# Patient Record
Sex: Female | Born: 1980 | Race: White | Hispanic: No | Marital: Single | Smoking: Never smoker
Health system: Southern US, Community
[De-identification: ages and names within clinical notes are randomized; demographics above are authoritative.]

## PROBLEM LIST (undated history)

## (undated) DIAGNOSIS — J45909 Unspecified asthma, uncomplicated: Secondary | ICD-10-CM

## (undated) DIAGNOSIS — E611 Iron deficiency: Secondary | ICD-10-CM

## (undated) HISTORY — PX: TONSILLECTOMY: SUR1361

---

## 2010-05-23 ENCOUNTER — Inpatient Hospital Stay (HOSPITAL_COMMUNITY): Admission: AD | Admit: 2010-05-23 | Discharge: 2010-05-23 | Payer: Self-pay | Admitting: Obstetrics and Gynecology

## 2010-05-27 ENCOUNTER — Inpatient Hospital Stay (HOSPITAL_COMMUNITY): Admission: AD | Admit: 2010-05-27 | Discharge: 2010-05-31 | Payer: Self-pay | Admitting: Obstetrics and Gynecology

## 2010-08-18 ENCOUNTER — Emergency Department (HOSPITAL_COMMUNITY): Admission: EM | Admit: 2010-08-18 | Discharge: 2010-08-19 | Payer: Self-pay | Admitting: Emergency Medicine

## 2011-03-13 LAB — CBC
HCT: 33.3 % — ABNORMAL LOW (ref 36.0–46.0)
HCT: 36 % (ref 36.0–46.0)
Hemoglobin: 11.2 g/dL — ABNORMAL LOW (ref 12.0–15.0)
Hemoglobin: 12.3 g/dL (ref 12.0–15.0)
MCHC: 33.7 g/dL (ref 30.0–36.0)
MCHC: 33.8 g/dL (ref 30.0–36.0)
MCV: 80.2 fL (ref 78.0–100.0)
MCV: 81.1 fL (ref 78.0–100.0)
MCV: 81.2 fL (ref 78.0–100.0)
Platelets: 232 10*3/uL (ref 150–400)
Platelets: 263 10*3/uL (ref 150–400)
RBC: 4.1 MIL/uL (ref 3.87–5.11)
RBC: 4.48 MIL/uL (ref 3.87–5.11)
RDW: 16.9 % — ABNORMAL HIGH (ref 11.5–15.5)
RDW: 16.9 % — ABNORMAL HIGH (ref 11.5–15.5)
WBC: 13.8 10*3/uL — ABNORMAL HIGH (ref 4.0–10.5)

## 2011-03-13 LAB — COMPREHENSIVE METABOLIC PANEL
ALT: 50 U/L — ABNORMAL HIGH (ref 0–35)
AST: 46 U/L — ABNORMAL HIGH (ref 0–37)
CO2: 26 mEq/L (ref 19–32)
Calcium: 8.5 mg/dL (ref 8.4–10.5)
Creatinine, Ser: 0.7 mg/dL (ref 0.4–1.2)
GFR calc Af Amer: >60 mL/min (ref >60)
GFR calc non Af Amer: >60 mL/min (ref >60)
Sodium: 136 mEq/L (ref 135–145)
Total Protein: 5.5 g/dL — ABNORMAL LOW (ref 6.0–8.3)

## 2019-09-04 DIAGNOSIS — J309 Allergic rhinitis, unspecified: Secondary | ICD-10-CM | POA: Insufficient documentation

## 2019-09-04 DIAGNOSIS — J45901 Unspecified asthma with (acute) exacerbation: Secondary | ICD-10-CM | POA: Insufficient documentation

## 2019-09-04 DIAGNOSIS — R438 Other disturbances of smell and taste: Secondary | ICD-10-CM | POA: Insufficient documentation

## 2019-09-04 DIAGNOSIS — J45909 Unspecified asthma, uncomplicated: Secondary | ICD-10-CM | POA: Insufficient documentation

## 2020-01-30 DIAGNOSIS — Z20822 Contact with and (suspected) exposure to covid-19: Secondary | ICD-10-CM | POA: Insufficient documentation

## 2020-01-30 DIAGNOSIS — R059 Cough, unspecified: Secondary | ICD-10-CM | POA: Insufficient documentation

## 2020-01-30 DIAGNOSIS — Z1152 Encounter for screening for COVID-19: Secondary | ICD-10-CM | POA: Insufficient documentation

## 2020-01-30 DIAGNOSIS — J45901 Unspecified asthma with (acute) exacerbation: Secondary | ICD-10-CM | POA: Insufficient documentation

## 2020-03-06 ENCOUNTER — Encounter: Payer: Self-pay | Admitting: Emergency Medicine

## 2020-03-06 ENCOUNTER — Ambulatory Visit
Admission: EM | Admit: 2020-03-06 | Discharge: 2020-03-06 | Disposition: A | Discharge status: Home / Self Care | Payer: Managed Care, Other (non HMO)

## 2020-03-06 ENCOUNTER — Other Ambulatory Visit: Payer: Self-pay

## 2020-03-06 ENCOUNTER — Ambulatory Visit (INDEPENDENT_AMBULATORY_CARE_PROVIDER_SITE_OTHER): Payer: Managed Care, Other (non HMO)

## 2020-03-06 DIAGNOSIS — R5383 Other fatigue: Secondary | ICD-10-CM

## 2020-03-06 DIAGNOSIS — J329 Chronic sinusitis, unspecified: Secondary | ICD-10-CM

## 2020-03-06 DIAGNOSIS — R05 Cough: Secondary | ICD-10-CM | POA: Diagnosis not present

## 2020-03-06 DIAGNOSIS — R053 Chronic cough: Secondary | ICD-10-CM

## 2020-03-06 DIAGNOSIS — J320 Chronic maxillary sinusitis: Secondary | ICD-10-CM

## 2020-03-06 DIAGNOSIS — R5382 Chronic fatigue, unspecified: Secondary | ICD-10-CM

## 2020-03-06 HISTORY — DX: Unspecified asthma, uncomplicated: J45.909

## 2020-03-06 MED ORDER — FLUTICASONE PROPIONATE 50 MCG/ACT NA SUSP: 1.0000 | Freq: Every day | NASAL | 0 refills | Status: AC

## 2020-03-06 MED ORDER — MONTELUKAST SODIUM 10 MG PO TABS: 10.0000 mg | ORAL_TABLET | Freq: Every day | ORAL | 0 refills | Status: DC

## 2020-03-06 MED ORDER — AEROCHAMBER PLUS FLO-VU MEDIUM MISC: 1.0000 | Freq: Once | 0 refills | Status: AC

## 2020-03-06 MED ORDER — PREDNISONE 10 MG (21) PO TBPK: ORAL_TABLET | Freq: Every day | ORAL | 0 refills | Status: DC

## 2020-03-06 NOTE — Discharge Instructions (Addendum)
Take steroid as discussed: 6-5-4-3-2-1 Follow-up with your primary care for further evaluation as you may need referral to specialist(s) and/or additional diagnostics that we do not have here. Go to ER for sudden onset of weakness, dizziness, nausea, chest pain, difficulty breathing, lower leg swelling

## 2020-03-06 NOTE — ED Notes (Signed)
Patient able to ambulate independently  

## 2020-03-06 NOTE — ED Provider Notes (Signed)
[name]    CSN: [id number] Arrival date & time: 03/06/20  3267      History   Chief Complaint Chief Complaint  Patient presents with  . Cough  . Fatigue    HPI Debbie Contreras is a 39 y.o. female with history of asthma presenting for persistent fatigue and URI symptoms for the last month.  Patient states she has previously been evaluated for this by her PCP 3 weeks ago.  Underwent chest x-ray and Covid testing which were both reportedly negative.  Given a prednisone taper for 6 days which provided relief of cough, dyspnea, fatigue, but "then came right back" upon discontinuation.  Patient states she feels tired just from talking, though denies dyspnea with exertion.  Denies history of GERD, thyroid disease.  Does have her albuterol and Symbicort inhalers at home.  Reporting compliance with 2 puffs twice daily regimen of Symbicort.  Currently using albuterol inhaler 2 times daily with moderate relief.  Patient denies lower extremity edema, history of DVT/PE, fever, chills, myalgias or malaise.  Able to eat, drink without difficulty.  No nausea, vomiting, abdominal pain or diarrhea.    Past Medical History:  Diagnosis Date  . Asthma     There are no problems to display for this patient.   History reviewed. No pertinent surgical history.  OB History   No obstetric history on file.      Home Medications    Prior to Admission medications   Medication Sig Start Date End Date Taking? Authorizing Provider  albuterol (VENTOLIN HFA) 108 (90 Base) MCG/ACT inhaler Inhale into the lungs every 6 (six) hours as needed for wheezing or shortness of breath.   Yes [provider]  budesonide-formoterol (SYMBICORT) 80-4.5 MCG/ACT inhaler Inhale 2 puffs into the lungs 2 (two) times daily.   Yes [provider]  fluticasone (FLONASE) 50 MCG/ACT nasal spray Place 1 spray into both nostrils daily. 03/06/20   Hall-Potvin, [place], PA-C  montelukast (SINGULAIR)  10 MG tablet Take 1 tablet (10 mg total) by mouth at bedtime. 03/06/20   Hall-Potvin, [place], PA-C  predniSONE (STERAPRED UNI-PAK 21 TAB) 10 MG (21) TBPK tablet Take by mouth daily. Take steroid taper as written 03/06/20   Hall-Potvin, [place], PA-C  Spacer/Aero-Holding Chambers (AEROCHAMBER PLUS FLO-VU MEDIUM) MISC 1 each by Other route once for 1 dose. 03/06/20 03/06/20  Hall-Potvin, [place], PA-C    Family History Family History  Problem Relation Age of Onset  . Cervical cancer Mother   . COPD Father     Social History Social History   Tobacco Use  . Smoking status: Never Smoker  . Smokeless tobacco: Never Used  Substance Use Topics  . Alcohol use: Not Currently  . Drug use: Never     Allergies   Patient has no known allergies.   Review of Systems As per HPI   Physical Exam Triage Vital Signs ED Triage Vitals  Enc Vitals Group     BP 03/06/20 0822 126/85     Pulse Rate 03/06/20 0822 81     Resp 03/06/20 0822 18     Temp 03/06/20 0822 97.6 F (36.4 C)     Temp Source 03/06/20 0822 Temporal     SpO2 03/06/20 0822 96 %     Weight --      Height --      Head Circumference --      Peak Flow --      Pain Score 03/06/20 0824 0  Pain Loc --      Pain Edu? --      Excl. in GC? --    No data found.  Updated Vital Signs BP 126/85 (BP Location: Left Arm)   Pulse 81   Temp 97.6 F (36.4 C) (Temporal)   Resp 18   SpO2 96%   Visual Acuity Right Eye Distance:   Left Eye Distance:   Bilateral Distance:    Right Eye Near:   Left Eye Near:    Bilateral Near:     Physical Exam Constitutional:      General: She is not in acute distress.    Appearance: She is obese. She is not ill-appearing or diaphoretic.  HENT:     Head: Normocephalic and atraumatic.     Nose:     Comments: Negative sinus tenderness bilaterally    Mouth/Throat:     Mouth: Mucous membranes are moist.     Pharynx: Oropharynx is clear. No oropharyngeal exudate or posterior oropharyngeal  erythema.     Comments: Uvula midline, nonedematous Eyes:     General: No scleral icterus.    Extraocular Movements: Extraocular movements intact.     Conjunctiva/sclera: Conjunctivae normal.     Pupils: Pupils are equal, round, and reactive to light.  Neck:     Comments: Trachea midline, negative JVD Cardiovascular:     Rate and Rhythm: Normal rate and regular rhythm.     Heart sounds: No murmur. No gallop.   Pulmonary:     Effort: Pulmonary effort is normal. No respiratory distress.     Breath sounds: No wheezing, rhonchi or rales.  Musculoskeletal:     Cervical back: Neck supple. No tenderness.     Right lower leg: No edema.     Left lower leg: No edema.  Lymphadenopathy:     Cervical: No cervical adenopathy.  Skin:    Capillary Refill: Capillary refill takes less than 2 seconds.     Coloration: Skin is not jaundiced or pale.     Findings: No rash.  Neurological:     General: No focal deficit present.     Mental Status: She is alert and oriented to person, place, and time.      UC Treatments / Results  Labs (all labs ordered are listed, but only abnormal results are displayed) Labs Reviewed - No data to display  EKG   Radiology DG Chest 2 View  Result Date: 03/06/2020 CLINICAL DATA:  Fatigue, cough EXAM: CHEST - 2 VIEW COMPARISON:  None. FINDINGS: Lungs are clear.  No pleural effusion or pneumothorax. The heart is normal in size. Visualized osseous structures are within normal limits. IMPRESSION: Normal chest radiographs. Electronically Signed   By: Charline Bills M.D.   On: 03/06/2020 09:15    Procedures Procedures (including critical care time)  Medications Ordered in UC Medications - No data to display  Initial Impression / Assessment and Plan / UC Course  I have reviewed the triage vital signs and the nursing notes.  Pertinent labs & imaging results that were available during my care of the patient were reviewed by me and considered in my medical  decision making (see chart for details).     Patient afebrile, nontoxic in office today.  Covid testing deferred due to duration of symptoms with previous negative test and no second worsening.  Chest x-ray done office, reviewed by me and radiology without previous to compare: Negative for pleural effusion, pneumothorax, cardiomegaly, infiltrate.  Reviewed findings with patient verbalized  understanding.  Will treat supportively as outlined below.  Refilled prednisone taper at patient's request, and provided contact information for ENT office for further evaluation as patient voiced frustration about chronic sinusitis "for 3 years now".  Return precautions discussed, patient verbalized understanding and is agreeable to plan. Final Clinical Impressions(s) / UC Diagnoses   Final diagnoses:  Chronic cough  Chronic fatigue  Chronic maxillary sinusitis     Discharge Instructions     Take steroid as discussed: 6-5-4-3-2-1 Follow-up with your primary care for further evaluation as you may need referral to specialist(s) and/or additional diagnostics that we do not have here. Go to ER for sudden onset of weakness, dizziness, nausea, chest pain, difficulty breathing, lower leg swelling    ED Prescriptions    Medication Sig Dispense Auth. Provider   montelukast (SINGULAIR) 10 MG tablet Take 1 tablet (10 mg total) by mouth at bedtime. 30 tablet Hall-Potvin, [place], PA-C   fluticasone (FLONASE) 50 MCG/ACT nasal spray Place 1 spray into both nostrils daily. 16 g Hall-Potvin, [place], PA-C   Spacer/Aero-Holding Chambers (AEROCHAMBER PLUS FLO-VU MEDIUM) MISC 1 each by Other route once for 1 dose. 1 each Hall-Potvin, [place], PA-C   predniSONE (STERAPRED UNI-PAK 21 TAB) 10 MG (21) TBPK tablet Take by mouth daily. Take steroid taper as written 21 tablet Hall-Potvin, [place], PA-C     PDMP not reviewed this encounter.   Hall-Potvin, [place], PA-C 03/06/20 0930

## 2020-03-06 NOTE — ED Triage Notes (Addendum)
Pt presents to [facility] for assessment of fatigue, cough, stuffy nose, sore throat x 1 month.  C/o shortness of breath with exertion, and difficulty getting a full breath in, even at rest.  Hx of asthma.  States "I don't feel like my inhaler isn't working".  Already seen by PCP, and COVID negative.

## 2020-03-15 DIAGNOSIS — J339 Nasal polyp, unspecified: Secondary | ICD-10-CM | POA: Insufficient documentation

## 2020-03-15 DIAGNOSIS — R430 Anosmia: Secondary | ICD-10-CM | POA: Insufficient documentation

## 2020-03-15 DIAGNOSIS — J45909 Unspecified asthma, uncomplicated: Secondary | ICD-10-CM | POA: Insufficient documentation

## 2020-03-30 DIAGNOSIS — J324 Chronic pansinusitis: Secondary | ICD-10-CM | POA: Insufficient documentation

## 2020-06-15 ENCOUNTER — Ambulatory Visit: Payer: Self-pay | Admitting: Otolaryngology

## 2020-06-15 NOTE — H&P (Addendum)
HPI:   Debbie Contreras is a 39 y.o. female who presents as a return Patient.   Current problem: Sinusitis.  HPI: Return visit. The improvement that she received after the treatment, has completely gone away and now she is completely stopped up and has anosmia again.  PMH/Meds/All/SocHx/FamHx/ROS:   Past Medical History:  Diagnosis Date  . Asthma   Past Surgical History:  Procedure Laterality Date  . NO PAST SURGERIES   No family history of bleeding disorders, wound healing problems or difficulty with anesthesia.   Social History   Socioeconomic History  . Marital status: Single  Spouse name: Not on file  . Number of children: Not on file  . Years of education: Not on file  . Highest education level: Not on file  Occupational History  . Not on file  Tobacco Use  . Smoking status: Former Smoker  Quit date: 2018  Years since quitting: 3.4  . Smokeless tobacco: Never Used  Substance and Sexual Activity  . Alcohol use: Not Currently  . Drug use: Not Currently  . Sexual activity: Not on file  Other Topics Concern  . Not on file  Social History Narrative  . Not on file   Social Determinants of Health   Financial Resource Strain:  . Difficulty of Paying Living Expenses:  Food Insecurity:  . Worried About Programme researcher, broadcasting/film/video in the Last Year:  . Barista in the Last Year:  Transportation Needs:  . Freight forwarder (Medical):  Marland Kitchen Lack of Transportation (Non-Medical):  Physical Activity:  . Days of Exercise per Week:  . Minutes of Exercise per Session:  Stress:  . Feeling of Stress :  Social Connections:  . Frequency of Communication with Friends and Family:  . Frequency of Social Gatherings with Friends and Family:  . Attends Religious Services:  . Active Member of Clubs or Organizations:  . Attends Banker Meetings:  Marland Kitchen Marital Status:   Current Outpatient Medications:  . AEROCHAMBER PLUS FLOW-VU Spcr, , Disp: , Rfl:  . albuterol 90  mcg/actuation inhaler, Inhale into the lungs., Disp: , Rfl:  . cetirizine (ZYRTEC) 10 MG tablet, , Disp: , Rfl:  . fluticasone propionate (FLONASE) 50 mcg/actuation nasal spray, , Disp: , Rfl:  . levonorgestreL (MIRENA) 20 mcg/24 hours (6 yrs) 52 mg IUD, Mirena 20 mcg/24 hours (6 yrs) 52 mg intrauterine device Take 1 device by intrauterine route., Disp: , Rfl:  . montelukast (SINGULAIR) 10 mg tablet, , Disp: , Rfl:  . predniSONE (DELTASONE) 20 MG tablet, Take 3 times a day for 3 days, two times a day for 3 days, one a day for 3 days., Disp: 18 tablet, Rfl: 0 . SYMBICORT 160-4.5 mcg/actuation inhaler, , Disp: , Rfl:    Physical Exam:   Overweight lady in no distress. She has a hyponasal voice. Her breathing is clear. 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 Oral cavity and pharynx are clear. Eyes look normal. No external swelling or masses.  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 She is interested in surgical correction of this. Recommend proceed with endoscopic nasal polypectomy and pan sinusectomy. We will do this at the hospital given her significant asthma history. Risks and benefits were discussed including injury to the eye and brain.

## 2020-06-17 ENCOUNTER — Encounter (HOSPITAL_COMMUNITY): Payer: Self-pay

## 2020-06-17 ENCOUNTER — Encounter (HOSPITAL_COMMUNITY)
Admission: RE | Admit: 2020-06-17 | Discharge: 2020-06-17 | Disposition: A | Discharge status: Home / Self Care | Payer: Managed Care, Other (non HMO) | Source: Ambulatory Visit | Attending: Otolaryngology | Admitting: Otolaryngology

## 2020-06-17 ENCOUNTER — Other Ambulatory Visit: Payer: Self-pay

## 2020-06-17 DIAGNOSIS — Z01812 Encounter for preprocedural laboratory examination: Secondary | ICD-10-CM | POA: Insufficient documentation

## 2020-06-17 LAB — CBC
HCT: 40.6 % (ref 36.0–46.0)
Hemoglobin: 13.1 g/dL (ref 12.0–15.0)
MCH: 27.2 pg (ref 26.0–34.0)
MCHC: 32.3 g/dL (ref 30.0–36.0)
MCV: 84.4 fL (ref 80.0–100.0)
Platelets: 309 10*3/uL (ref 150–400)
RBC: 4.81 MIL/uL (ref 3.87–5.11)
RDW: 13.7 % (ref 11.5–15.5)
WBC: 9.8 10*3/uL (ref 4.0–10.5)
nRBC: 0 % (ref 0.0–0.2)

## 2020-06-17 NOTE — Pre-Procedure Instructions (Signed)
[facility] [place], [place] [address] [place] [address] Phone: [phone] Fax: [phone]  [facility] [address], [place] [address] Phone: [phone] Fax: [phone]      Your procedure is scheduled on Friday July 2nd.  Report to [facility] Main Entrance "A" at 8:30 A.M., and check in at the Admitting office.  Call this number if you have problems the morning of surgery:  [phone]  Call [phone] if you have any questions prior to your surgery date Monday-Friday 8am-4pm    Remember:  Do not eat or drink after midnight the night before your surgery     Take these medicines the morning of surgery with A SIP OF WATER  budesonide-formoterol (SYMBICORT)  cetirizine (ZYRTEC) fluticasone (FLONASE) albuterol (VENTOLIN HFA) if needed- bring inhalers to the hospital with you  As of today, STOP taking any Aspirin (unless otherwise instructed by your surgeon) and Aspirin containing products, Aleve, Naproxen, Ibuprofen, Motrin, Advil, Goody's, BC's, all herbal medications, fish oil, and all vitamins.                      Do not wear jewelry, make up, or nail polish            Do not wear lotions, powders, perfumes/colognes, or deodorant.            Do not shave 48 hours prior to surgery.  Men may shave face and neck.            Do not bring valuables to the hospital.            [facility] is not responsible for any belongings or valuables.  Do NOT Smoke (Tobacco/Vapping) or drink Alcohol 24 hours prior to your procedure If you use a CPAP at night, you may bring all equipment for your overnight stay.   Contacts, glasses, dentures or bridgework may not be worn into surgery.      For patients admitted to the hospital, discharge time will be determined by your treatment team.   Patients discharged the day of  surgery will not be allowed to drive home, and someone needs to stay with them for 24 hours.    Special instructions:   [place]- Preparing For Surgery  Before surgery, you can play an important role. Because skin is not sterile, your skin needs to be as free of germs as possible. You can reduce the number of germs on your skin by washing with CHG (chlorahexidine gluconate) Soap before surgery.  CHG is an antiseptic cleaner which kills germs and bonds with the skin to continue killing germs even after washing.    Oral Hygiene is also important to reduce your risk of infection.  Remember - BRUSH YOUR TEETH THE MORNING OF SURGERY WITH YOUR REGULAR TOOTHPASTE  Please do not use if you have an allergy to CHG or antibacterial soaps. If your skin becomes reddened/irritated stop using the CHG.  Do not shave (including legs and underarms) for at least 48 hours prior to first CHG shower. It is OK to shave your face.  Please follow these instructions carefully.   1. Shower the NIGHT BEFORE SURGERY and the MORNING OF SURGERY with CHG Soap.   2. If you chose to wash your hair, wash your hair first as usual with your  normal shampoo.  3. After you shampoo, rinse your hair and body thoroughly to remove the shampoo.  4. Use CHG as you would any other liquid soap. You can apply CHG directly to the skin and wash gently with a scrungie or a clean washcloth.   5. Apply the CHG Soap to your body ONLY FROM THE NECK DOWN.  Do not use on open wounds or open sores. Avoid contact with your eyes, ears, mouth and genitals (private parts). Wash Face and genitals (private parts)  with your normal soap.   6. Wash thoroughly, paying special attention to the area where your surgery will be performed.  7. Thoroughly rinse your body with warm water from the neck down.  8. DO NOT shower/wash with your normal soap after using and rinsing off the CHG Soap.  9. Pat yourself dry with a CLEAN TOWEL.  10. Wear CLEAN  PAJAMAS to bed the night before surgery, wear comfortable clothes the morning of surgery  11. Place CLEAN SHEETS on your bed the night of your first shower and DO NOT SLEEP WITH PETS.   Day of Surgery:   Do not apply any deodorants/lotions.  Please wear clean clothes to the hospital/surgery center.   Remember to brush your teeth WITH YOUR REGULAR TOOTHPASTE.   Please read over the following fact sheets that you were given.

## 2020-06-22 ENCOUNTER — Other Ambulatory Visit (HOSPITAL_COMMUNITY): Payer: Managed Care, Other (non HMO)

## 2020-06-24 ENCOUNTER — Other Ambulatory Visit (HOSPITAL_COMMUNITY): Payer: Managed Care, Other (non HMO)

## 2020-06-25 ENCOUNTER — Ambulatory Visit (HOSPITAL_COMMUNITY)
Admission: RE | Admit: 2020-06-25 | Payer: Managed Care, Other (non HMO) | Source: Home / Self Care | Admitting: Otolaryngology

## 2020-06-25 ENCOUNTER — Encounter (HOSPITAL_COMMUNITY): Admission: RE | Payer: Self-pay | Source: Home / Self Care

## 2020-06-25 SURGERY — SURGERY, PARANASAL SINUS, ENDOSCOPIC, WITH NASAL SEPTOPLASTY, TURBINOPLASTY, AND MAXILLARY SINUSOTOMY
Anesthesia: General | Laterality: Bilateral

## 2020-11-22 ENCOUNTER — Other Ambulatory Visit: Payer: Self-pay

## 2020-11-22 ENCOUNTER — Ambulatory Visit
Admission: EM | Admit: 2020-11-22 | Discharge: 2020-11-22 | Disposition: A | Discharge status: Home / Self Care | Payer: Managed Care, Other (non HMO) | Attending: Family Medicine | Admitting: Family Medicine

## 2020-11-22 DIAGNOSIS — R109 Unspecified abdominal pain: Secondary | ICD-10-CM | POA: Diagnosis not present

## 2020-11-22 LAB — POCT URINALYSIS DIP (MANUAL ENTRY)
Bilirubin, UA: NEGATIVE
Blood, UA: NEGATIVE
Glucose, UA: NEGATIVE mg/dL
Ketones, POC UA: NEGATIVE mg/dL
Nitrite, UA: NEGATIVE
Protein Ur, POC: NEGATIVE mg/dL
Spec Grav, UA: 1.02 (ref 1.010–1.025)
Urobilinogen, UA: 0.2 E.U./dL
pH, UA: 8.5 — AB (ref 5.0–8.0)

## 2020-11-22 MED ORDER — CEPHALEXIN 500 MG PO CAPS
500.0000 mg | ORAL_CAPSULE | Freq: Three times a day (TID) | ORAL | 0 refills | Status: DC
Start: 2020-11-22 — End: 2021-07-27

## 2020-11-22 NOTE — ED Provider Notes (Signed)
[name]    CSN: [id number] Arrival date & time: 11/22/20  1054      History   Chief Complaint Chief Complaint  Patient presents with  . Abdominal Pain    HPI Debbie Contreras is a 39 y.o. female.   Established [facility] patient.  Pt c/o lower abd pain, right flank pain started 2 days-denies vaginal d/c, denies urinary-states she feels pain may be a result of recent squat challenge-NAD-steady gait.  Patient has IUD  Patient has been exercising much more for 2 weeks.  The pain began Saturday night when lying down and built in intensity until 24 hours ago.  Since then, the pain has lessened.  Pain is along the T11 dermatome on the right.     Past Medical History:  Diagnosis Date  . Asthma     There are no problems to display for this patient.   Past Surgical History:  Procedure Laterality Date  . CESAREAN SECTION    . TONSILLECTOMY     age 27    OB History   No obstetric history on file.      Home Medications    Prior to Admission medications   Medication Sig Start Date End Date Taking? Authorizing Provider  albuterol (VENTOLIN HFA) 108 (90 Base) MCG/ACT inhaler Inhale 2 puffs into the lungs every 6 (six) hours as needed for wheezing or shortness of breath.     [provider]  budesonide-formoterol (SYMBICORT) 160-4.5 MCG/ACT inhaler Inhale 2 puffs into the lungs 2 (two) times daily.    [provider]  cephALEXin (KEFLEX) 500 MG capsule Take 1 capsule (500 mg total) by mouth 3 (three) times daily. 11/22/20   Elvina Sidle, MD  cetirizine (ZYRTEC) 10 MG tablet Take 10 mg by mouth daily as needed for allergies.    [provider]  diphenhydrAMINE (BENADRYL) 25 MG tablet Take 25 mg by mouth at bedtime as needed for sleep.    [provider]  fluticasone (FLONASE) 50 MCG/ACT nasal spray Place 1 spray into both nostrils daily. Patient taking differently: Place 2 sprays into both nostrils daily as needed for  allergies.  03/06/20   Hall-Potvin, [place], PA-C  montelukast (SINGULAIR) 10 MG tablet Take 1 tablet (10 mg total) by mouth at bedtime. Patient taking differently: Take 10 mg by mouth daily as needed (allergies).  03/06/20   Hall-Potvin, [place], PA-C    Family History Family History  Problem Relation Age of Onset  . Cervical cancer Mother   . COPD Father     Social History Social History   Tobacco Use  . Smoking status: Never Smoker  . Smokeless tobacco: Never Used  Vaping Use  . Vaping Use: Never used  Substance Use Topics  . Alcohol use: Not Currently  . Drug use: Never     Allergies   Patient has no known allergies.   Review of Systems Review of Systems  Constitutional: Negative.   Genitourinary: Positive for flank pain. Negative for hematuria.  All other systems reviewed and are negative.    Physical Exam Triage Vital Signs ED Triage Vitals  Enc Vitals Group     BP 11/22/20 1116 112/86     Pulse Rate 11/22/20 1116 73     Resp 11/22/20 1116 18     Temp 11/22/20 1116 (!) 97.4 F (36.3 C)     Temp Source 11/22/20 1116 Oral     SpO2 11/22/20 1116 95 %     Weight 11/22/20 1113 200  lb (90.7 kg)     Height 11/22/20 1113 5\' 1"  (1.549 m)     Head Circumference --      Peak Flow --      Pain Score 11/22/20 1113 6     Pain Loc --      Pain Edu? --      Excl. in GC? --    No data found.  Updated Vital Signs BP 112/86 (BP Location: Left Arm)   Pulse 73   Temp (!) 97.4 F (36.3 C) (Oral)   Resp 18   Ht 5\' 1"  (1.549 m)   Wt 90.7 kg   SpO2 95%   BMI 37.79 kg/m    Physical Exam Vitals and nursing note reviewed.  Constitutional:      General: She is not in acute distress.    Appearance: She is well-developed. She is obese. She is not ill-appearing.  HENT:     Head: Normocephalic.     Mouth/Throat:     Mouth: Mucous membranes are moist.  Eyes:     Extraocular Movements: Extraocular movements intact.  Cardiovascular:     Rate and Rhythm: Normal  rate.  Pulmonary:     Effort: Pulmonary effort is normal.  Abdominal:     Palpations: Abdomen is soft.     Tenderness: There is no abdominal tenderness. There is no right CVA tenderness or left CVA tenderness.  Skin:    General: Skin is warm and dry.  Neurological:     General: No focal deficit present.     Mental Status: She is alert.  Psychiatric:        Mood and Affect: Mood normal.      UC Treatments / Results  Labs (all labs ordered are listed, but only abnormal results are displayed) Labs Reviewed  POCT URINALYSIS DIP (MANUAL ENTRY) - Abnormal; Notable for the following components:      Result Value   pH, UA 8.5 (*)    Leukocytes, UA Small (1+) (*)    All other components within normal limits  URINE CULTURE    EKG   Radiology No results found.  Procedures Procedures (including critical care time)  Medications Ordered in UC Medications - No data to display  Initial Impression / Assessment and Plan / UC Course  I have reviewed the triage vital signs and the nursing notes.  Pertinent labs & imaging results that were available during my care of the patient were reviewed by me and considered in my medical decision making (see chart for details).    Final Clinical Impressions(s) / UC Diagnoses   Final diagnoses:  Right flank pain     Discharge Instructions     The pain could be coming from your kidneys (kidney stone) or a mild urinary infection.  It could also be coming from an ovarian cyst that spontaneously formed and is resolving.  At this point, since you're improving in terms of pain, I think an ultrasound is unnecessary.  Nevertheless if the pain starts getting worse, please call or come back and we will get the necessary ultrasound test.  You do have some inflammation in the urine and so we are treating you for urinary infection just to be on the safe side.    ED Prescriptions    Medication Sig Dispense Auth. Provider   cephALEXin (KEFLEX) 500 MG  capsule Take 1 capsule (500 mg total) by mouth 3 (three) times daily. 21 capsule 11/24/20, MD     I  have reviewed the PDMP during this encounter.   Elvina Sidle, MD 11/22/20 1141

## 2020-11-22 NOTE — ED Triage Notes (Addendum)
Pt c/o lower abd pain, right flank pain started 2 days-denies vaginal d/c, denies urinary sx-states she feels pain may be r/t to recent squat challenge-NAD-steady gait

## 2020-11-22 NOTE — Discharge Instructions (Signed)
The pain could be coming from your kidneys (kidney stone) or a mild urinary infection.  It could also be coming from an ovarian cyst that spontaneously formed and is resolving.  At this point, since you're improving in terms of pain, I think an ultrasound is unnecessary.  Nevertheless if the pain starts getting worse, please call or come back and we will get the necessary ultrasound test.  You do have some inflammation in the urine and so we are treating you for urinary infection just to be on the safe side.

## 2020-11-23 LAB — URINE CULTURE: Culture: NO GROWTH

## 2021-07-21 IMAGING — DX DG CHEST 2V
2 series · 2 of 2 positions shown · non-contrast
Comparison: None.

CLINICAL DATA: Fatigue, cough

EXAM:
CHEST - 2 VIEW

[chest pa]
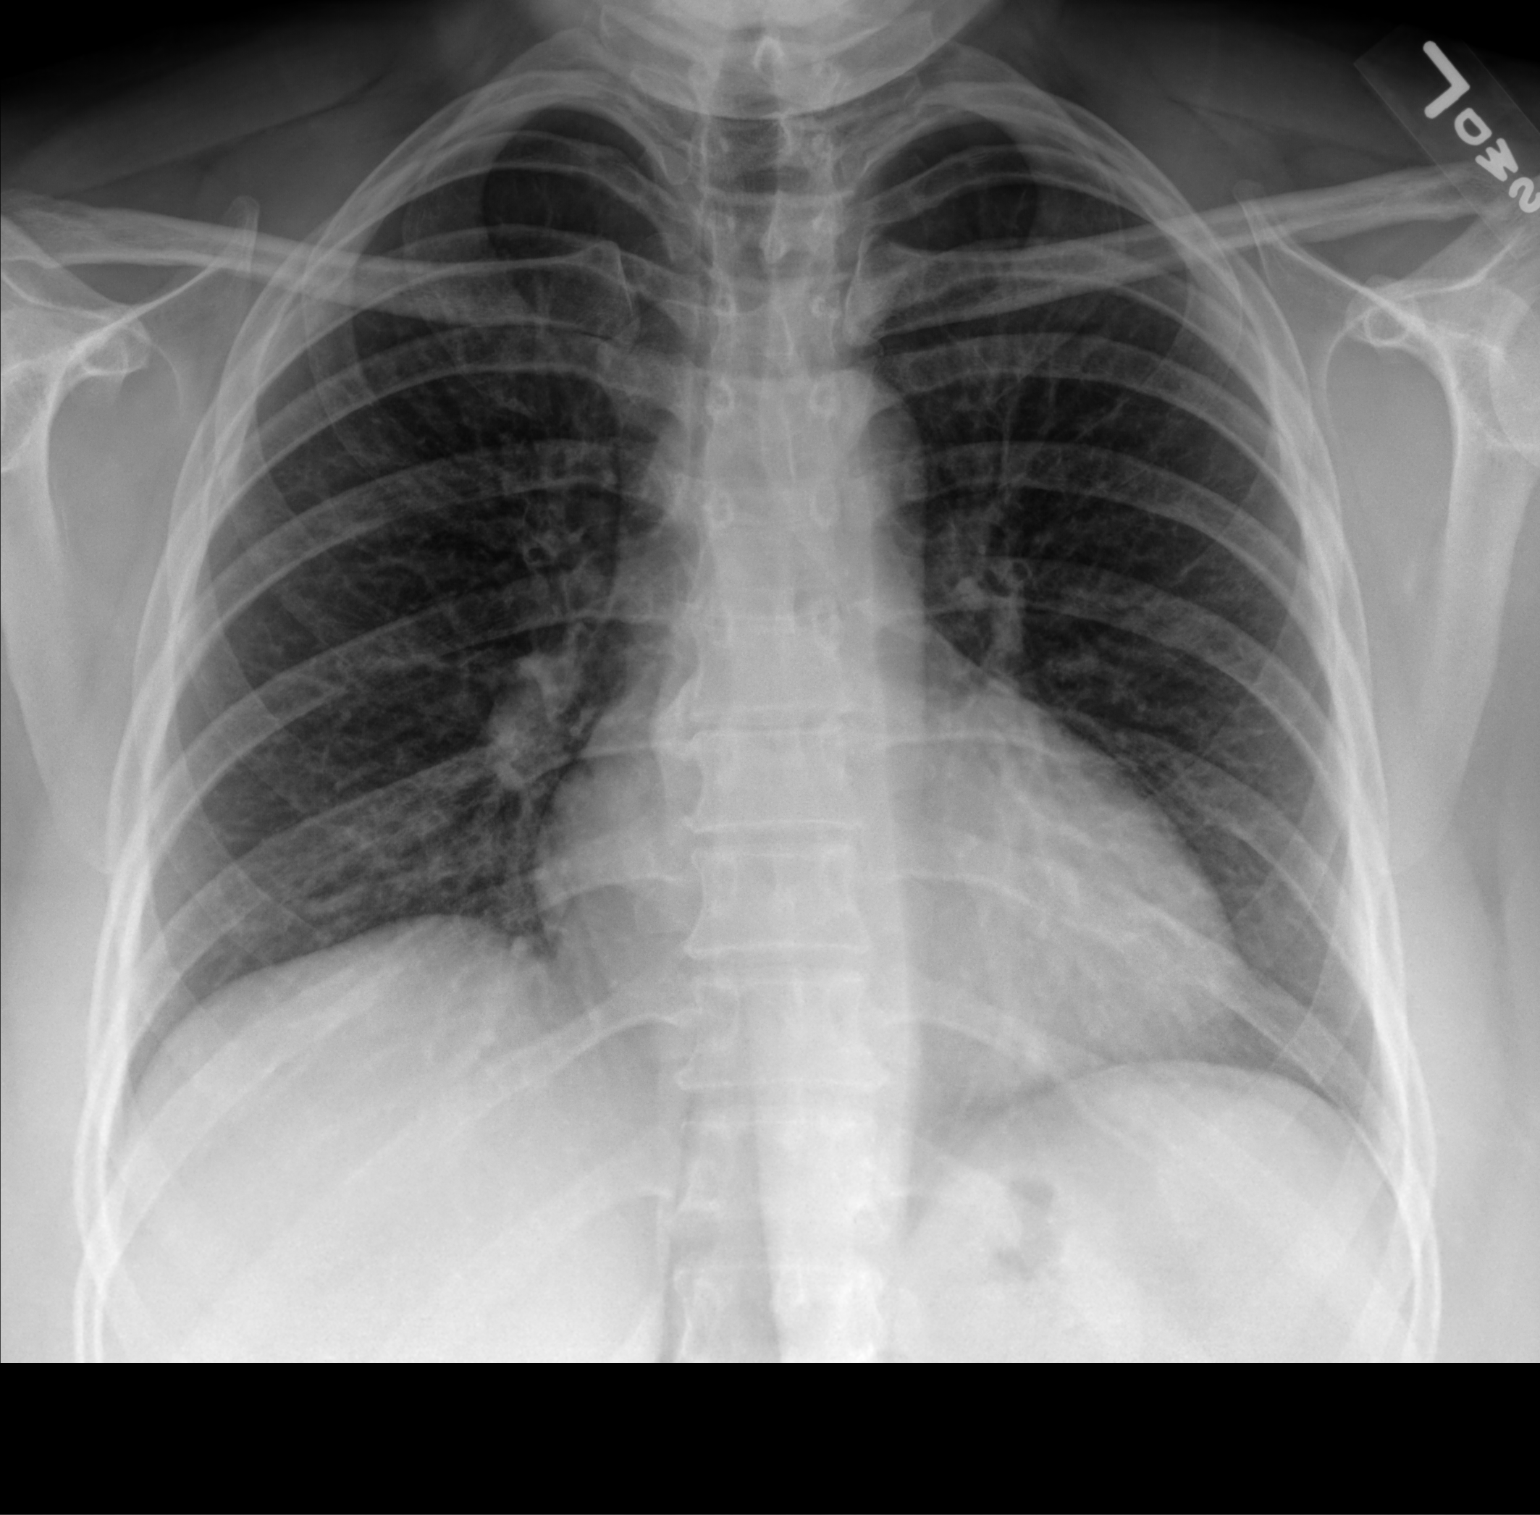

[chest lat]
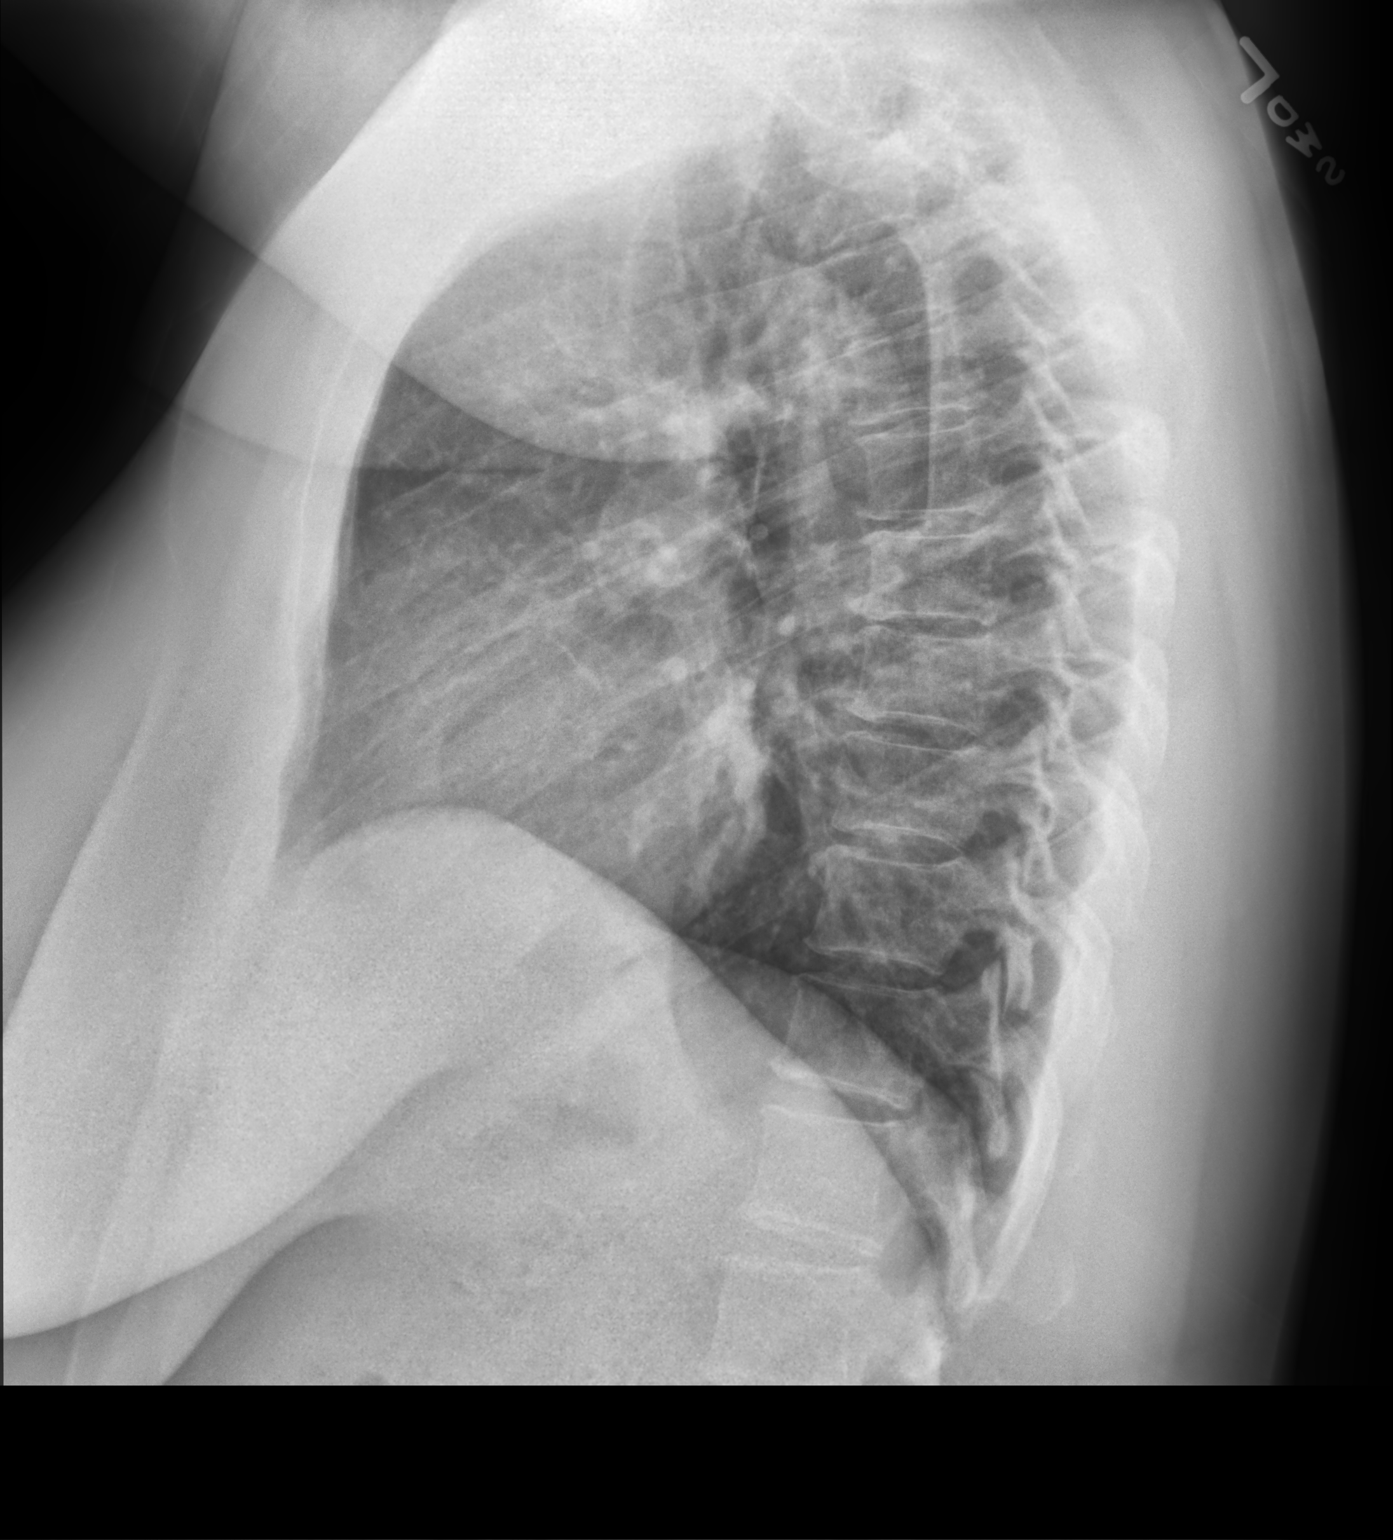

[2 of 2 positions shown; findings below may reference images not displayed]

FINDINGS: Lungs are clear.  No pleural effusion or pneumothorax.

The heart is normal in size.

Visualized osseous structures are within normal limits.
IMPRESSION: Normal chest radiographs.

## 2021-07-27 ENCOUNTER — Ambulatory Visit
Admission: EM | Admit: 2021-07-27 | Discharge: 2021-07-27 | Disposition: A | Discharge status: Home / Self Care | Payer: Managed Care, Other (non HMO) | Attending: Family Medicine | Admitting: Family Medicine

## 2021-07-27 ENCOUNTER — Other Ambulatory Visit: Payer: Self-pay

## 2021-07-27 ENCOUNTER — Encounter: Payer: Self-pay | Admitting: Emergency Medicine

## 2021-07-27 DIAGNOSIS — K0889 Other specified disorders of teeth and supporting structures: Secondary | ICD-10-CM | POA: Diagnosis not present

## 2021-07-27 DIAGNOSIS — R220 Localized swelling, mass and lump, head: Secondary | ICD-10-CM

## 2021-07-27 DIAGNOSIS — J3489 Other specified disorders of nose and nasal sinuses: Secondary | ICD-10-CM | POA: Diagnosis not present

## 2021-07-27 MED ORDER — AMOXICILLIN-POT CLAVULANATE 875-125 MG PO TABS
1.0000 | ORAL_TABLET | Freq: Two times a day (BID) | ORAL | 0 refills | Status: DC
Start: 2021-07-27 — End: 2021-10-06

## 2021-07-27 NOTE — ED Provider Notes (Signed)
[name]    CSN: [id number] Arrival date & time: 07/27/21  0807      History   Chief Complaint Chief Complaint  Patient presents with   Left sided facial swelling    HPI Debbie Contreras is a 40 y.o. female.   Patient presenting today with 3-day history of left-sided facial pain and swelling, sinus pain and pressure, dental pain.  Denies fever, chills, sore throat, difficulty breathing or swallowing, recent sick contacts.  Known history of seasonal allergies, but also known history of dental issues with infection in the past and acted similar.  Has been trying nasal sprays, Benadryl, ibuprofen with minimal relief.  Facial swelling continues to worsen day by day.    Past Medical History:  Diagnosis Date   Asthma     There are no problems to display for this patient.   Past Surgical History:  Procedure Laterality Date   CESAREAN SECTION     TONSILLECTOMY     age 40    OB History   No obstetric history on file.      Home Medications    Prior to Admission medications   Medication Sig Start Date End Date Taking? Authorizing Provider  albuterol (VENTOLIN HFA) 108 (90 Base) MCG/ACT inhaler Inhale 2 puffs into the lungs every 6 (six) hours as needed for wheezing or shortness of breath.    Yes [provider]  amoxicillin-clavulanate (AUGMENTIN) 875-125 MG tablet Take 1 tablet by mouth every 12 (twelve) hours. 07/27/21  Yes Particia Nearing, PA-C  budesonide-formoterol [facility]) 160-4.5 MCG/ACT inhaler Inhale 2 puffs into the lungs 2 (two) times daily.   Yes [provider]  cetirizine (ZYRTEC) 10 MG tablet Take 10 mg by mouth daily as needed for allergies.    [provider]  diphenhydrAMINE (BENADRYL) 25 MG tablet Take 25 mg by mouth at bedtime as needed for sleep.    [provider]  fluticasone (FLONASE) 50 MCG/ACT nasal spray Place 1 spray into both nostrils daily. Patient taking differently: Place 2 sprays into  both nostrils daily as needed for allergies. 03/06/20   Hall-Potvin, [place], PA-C  montelukast (SINGULAIR) 10 MG tablet Take 1 tablet (10 mg total) by mouth at bedtime. Patient taking differently: Take 10 mg by mouth daily as needed (allergies). 03/06/20   Hall-Potvin, [place], PA-C    Family History Family History  Problem Relation Age of Onset   Cervical cancer Mother    COPD Father     Social History Social History   Tobacco Use   Smoking status: Never   Smokeless tobacco: Never  Vaping Use   Vaping Use: Never used  Substance Use Topics   Alcohol use: Not Currently   Drug use: Never     Allergies   Patient has no known allergies.   Review of Systems Review of Systems Per HPI  Physical Exam Triage Vital Signs ED Triage Vitals  Enc Vitals Group     BP 07/27/21 0818 136/83     Pulse Rate 07/27/21 0818 83     Resp --      Temp 07/27/21 0818 98.2 F (36.8 C)     Temp Source 07/27/21 0818 Oral     SpO2 07/27/21 0818 95 %     Weight 07/27/21 0819 200 lb (90.7 kg)     Height 07/27/21 0819 5\' 2"  (1.575 m)     Head Circumference --      Peak Flow --      Pain  Score 07/27/21 0819 6     Pain Loc --      Pain Edu? --      Excl. in [place]? --    No data found.  Updated Vital Signs BP 136/83 (BP Location: Left Arm)   Pulse 83   Temp 98.2 F (36.8 C) (Oral)   Ht 5\' 2"  (1.575 m)   Wt 200 lb (90.7 kg)   SpO2 95%   BMI 36.58 kg/m   Visual Acuity Right Eye Distance:   Left Eye Distance:   Bilateral Distance:    Right Eye Near:   Left Eye Near:    Bilateral Near:     Physical Exam Vitals and nursing note reviewed.  Constitutional:      Appearance: Normal appearance. She is not ill-appearing.  HENT:     Head: Atraumatic.     Comments: Moderate left-sided facial swelling over cheek and dry region, significantly tender to palpation, firm    Right Ear: Tympanic membrane normal.     Left Ear: Tympanic membrane normal.     Nose:     Comments: Nasal mucosa  erythematous and edematous, but no rhinorrhea or congestion on exam    Mouth/Throat:     Mouth: Mucous membranes are moist.     Pharynx: Oropharynx is clear.     Comments: No obvious dental infection noted on exam, poor dentition sporadically but no abscess, gingival erythema or edema apparent on exam Eyes:     Extraocular Movements: Extraocular movements intact.     Conjunctiva/sclera: Conjunctivae normal.  Cardiovascular:     Rate and Rhythm: Normal rate and regular rhythm.     Heart sounds: Normal heart sounds.  Pulmonary:     Effort: Pulmonary effort is normal.     Breath sounds: Normal breath sounds.  Musculoskeletal:        General: Normal range of motion.     Cervical back: Normal range of motion and neck supple.  Skin:    General: Skin is warm and dry.  Neurological:     Mental Status: She is alert and oriented to person, place, and time.  Psychiatric:        Mood and Affect: Mood normal.        Thought Content: Thought content normal.        Judgment: Judgment normal.     UC Treatments / Results  Labs (all labs ordered are listed, but only abnormal results are displayed) Labs Reviewed - No data to display  EKG   Radiology No results found.  Procedures Procedures (including critical care time)  Medications Ordered in UC Medications - No data to display  Initial Impression / Assessment and Plan / UC Course  I have reviewed the triage vital signs and the nursing notes.  Pertinent labs & imaging results that were available during my care of the patient were reviewed by me and considered in my medical decision making (see chart for details).     Unclear if representative of developing sinus infection or dental infection, infected salivary stone, etc.  Given extent worsening course, will cover with Augmentin and discussed warm compresses, ibuprofen, rest.  Follow-up if worsening or not resolving.  Final Clinical Impressions(s) / UC Diagnoses   Final  diagnoses:  Pain, dental  Facial swelling  Sinus pain   Discharge Instructions   None    ED Prescriptions     Medication Sig Dispense Auth. Provider   amoxicillin-clavulanate (AUGMENTIN) 875-125 MG tablet Take 1 tablet by  mouth every 12 (twelve) hours. 14 tablet Particia Nearing, [place]      PDMP not reviewed this encounter.   [place], Regner Raymond, [place] 07/27/21 [phone]

## 2021-07-27 NOTE — ED Triage Notes (Signed)
Patient c/o left sided facial swelling for a couple of days.  Patient is also having sinus pain and soreness under eyes.  Patient has taken Benadryl.

## 2021-10-06 ENCOUNTER — Other Ambulatory Visit: Payer: Self-pay

## 2021-10-06 ENCOUNTER — Ambulatory Visit
Admission: EM | Admit: 2021-10-06 | Discharge: 2021-10-06 | Disposition: A | Discharge status: Home / Self Care | Payer: Managed Care, Other (non HMO) | Attending: Internal Medicine | Admitting: Internal Medicine

## 2021-10-06 DIAGNOSIS — J029 Acute pharyngitis, unspecified: Secondary | ICD-10-CM

## 2021-10-06 DIAGNOSIS — H65191 Other acute nonsuppurative otitis media, right ear: Secondary | ICD-10-CM | POA: Diagnosis not present

## 2021-10-06 DIAGNOSIS — R051 Acute cough: Secondary | ICD-10-CM | POA: Diagnosis not present

## 2021-10-06 DIAGNOSIS — J069 Acute upper respiratory infection, unspecified: Secondary | ICD-10-CM | POA: Insufficient documentation

## 2021-10-06 LAB — POCT RAPID STREP A (OFFICE): Rapid Strep A Screen: NEGATIVE

## 2021-10-06 MED ORDER — AMOXICILLIN-POT CLAVULANATE 875-125 MG PO TABS
1.0000 | ORAL_TABLET | Freq: Two times a day (BID) | ORAL | 0 refills | Status: DC
Start: 2021-10-06 — End: 2021-10-06

## 2021-10-06 MED ORDER — CEFDINIR 300 MG PO CAPS: 300.0000 mg | ORAL_CAPSULE | Freq: Two times a day (BID) | ORAL | 0 refills | Status: AC

## 2021-10-06 NOTE — ED Triage Notes (Signed)
Pt c/o cough, headache, sore throat and right ear ache onset about a week ago. Denies nausea, vomiting, diarrhea, constipation, body aches chills, fever. States tried motrin at home without relief.

## 2021-10-06 NOTE — ED Provider Notes (Signed)
[name]    CSN: [id number] Arrival date & time: 10/06/21  1834      History   Chief Complaint Chief Complaint  Patient presents with   Otalgia    HPI Debbie Contreras is a 39 y.o. female.   Patient presents with cough, headache, sore throat, nasal congestion, right earache that started approximately 1 week ago.  Denies nausea, vomiting, diarrhea, abdominal pain, body aches, chills, fever, any known sick contacts.  Patient denies chest pain or shortness of breath.  Has not taken any medications to help alleviate symptoms.   Otalgia  Past Medical History:  Diagnosis Date   Asthma     There are no problems to display for this patient.   Past Surgical History:  Procedure Laterality Date   CESAREAN SECTION     TONSILLECTOMY     age 89    OB History   No obstetric history on file.      Home Medications    Prior to Admission medications   Medication Sig Start Date End Date Taking? Authorizing Provider  cefdinir (OMNICEF) 300 MG capsule Take 1 capsule (300 mg total) by mouth 2 (two) times daily for 10 days. 10/06/21 10/16/21 Yes Lance Muss, FNP  albuterol (VENTOLIN HFA) 108 (90 Base) MCG/ACT inhaler Inhale 2 puffs into the lungs every 6 (six) hours as needed for wheezing or shortness of breath.     [provider]  budesonide-formoterol (SYMBICORT) 160-4.5 MCG/ACT inhaler Inhale 2 puffs into the lungs 2 (two) times daily.    [provider]  cetirizine (ZYRTEC) 10 MG tablet Take 10 mg by mouth daily as needed for allergies.    [provider]  diphenhydrAMINE (BENADRYL) 25 MG tablet Take 25 mg by mouth at bedtime as needed for sleep.    [provider]  fluticasone (FLONASE) 50 MCG/ACT nasal spray Place 1 spray into both nostrils daily. Patient taking differently: Place 2 sprays into both nostrils daily as needed for allergies. 03/06/20   Hall-Potvin, [place], PA-C  montelukast (SINGULAIR) 10 MG tablet Take 1  tablet (10 mg total) by mouth at bedtime. Patient taking differently: Take 10 mg by mouth daily as needed (allergies). 03/06/20   Hall-Potvin, [place], PA-C    Family History Family History  Problem Relation Age of Onset   Cervical cancer Mother    COPD Father     Social History Social History   Tobacco Use   Smoking status: Never   Smokeless tobacco: Never  Vaping Use   Vaping Use: Never used  Substance Use Topics   Alcohol use: Not Currently   Drug use: Never     Allergies   Patient has no known allergies.   Review of Systems Review of Systems Per HPI  Physical Exam Triage Vital Signs ED Triage Vitals  Enc Vitals Group     BP 10/06/21 1921 136/86     Pulse Rate 10/06/21 1921 79     Resp 10/06/21 1921 20     Temp 10/06/21 1921 98 F (36.7 C)     Temp Source 10/06/21 1921 Oral     SpO2 10/06/21 1921 96 %     Weight --      Height --      Head Circumference --      Peak Flow --      Pain Score 10/06/21 1926 3     Pain Loc --      Pain Edu? --  Excl. in [place]? --    No data found.  Updated Vital Signs BP 136/86 (BP Location: Left Arm)   Pulse 79   Temp 98 F (36.7 C) (Oral)   Resp 20   SpO2 96%   Visual Acuity Right Eye Distance:   Left Eye Distance:   Bilateral Distance:    Right Eye Near:   Left Eye Near:    Bilateral Near:     Physical Exam Constitutional:      General: She is not in acute distress.    Appearance: Normal appearance. She is not toxic-appearing or diaphoretic.  HENT:     Head: Normocephalic and atraumatic.     Right Ear: Ear canal normal. No drainage, swelling or tenderness. No middle ear effusion. No mastoid tenderness. Tympanic membrane is erythematous. Tympanic membrane is not perforated or bulging.     Left Ear: Tympanic membrane and ear canal normal.     Nose: Congestion and rhinorrhea present. Rhinorrhea is purulent.     Mouth/Throat:     Mouth: Mucous membranes are moist.     Pharynx: Posterior oropharyngeal  erythema present.  Eyes:     Extraocular Movements: Extraocular movements intact.     Conjunctiva/sclera: Conjunctivae normal.     Pupils: Pupils are equal, round, and reactive to light.  Cardiovascular:     Rate and Rhythm: Normal rate and regular rhythm.     Pulses: Normal pulses.     Heart sounds: Normal heart sounds.  Pulmonary:     Effort: Pulmonary effort is normal. No respiratory distress.     Breath sounds: Normal breath sounds. No wheezing.  Abdominal:     General: Abdomen is flat. Bowel sounds are normal.     Palpations: Abdomen is soft.  Musculoskeletal:        General: Normal range of motion.     Cervical back: Normal range of motion.  Skin:    General: Skin is warm and dry.  Neurological:     General: No focal deficit present.     Mental Status: She is alert and oriented to person, place, and time. Mental status is at baseline.  Psychiatric:        Mood and Affect: Mood normal.        Behavior: Behavior normal.     UC Treatments / Results  Labs (all labs ordered are listed, but only abnormal results are displayed) Labs Reviewed  CULTURE, GROUP A STREP ([facility])  NOVEL CORONAVIRUS, NAA  POCT RAPID STREP A (OFFICE)    EKG   Radiology No results found.  Procedures Procedures (including critical care time)  Medications Ordered in UC Medications - No data to display  Initial Impression / Assessment and Plan / UC Course  I have reviewed the triage vital signs and the nursing notes.  Pertinent labs & imaging results that were available during my care of the patient were reviewed by me and considered in my medical decision making (see chart for details).     Will treat acute respiratory infection as well as right otitis media with cefdinir antibiotic x10 days.  Patient is nontoxic-appearing and does not appear to be in need of immediate medical attention in the hospital at this time.  Rapid strep test was negative.  Throat culture and COVID-19 viral swab are  pending.Discussed strict return precautions. Patient verbalized understanding and is agreeable with plan.  Final Clinical Impressions(s) / UC Diagnoses   Final diagnoses:  Other non-recurrent acute nonsuppurative otitis media of right  ear  Acute upper respiratory infection  Sore throat  Acute cough     Discharge Instructions      You are being treated with cefdinir antibiotic to treat ear infection and upper respiratory infection.  Rapid strep test was negative.  Throat culture and COVID-19 viral swab are pending.  We will call if they are positive.  You may also take over-the-counter medications help alleviate cough and congestion.     ED Prescriptions     Medication Sig Dispense Auth. Provider   amoxicillin-clavulanate (AUGMENTIN) 875-125 MG tablet  (Status: Discontinued) Take 1 tablet by mouth every 12 (twelve) hours. 14 tablet Henrene Dodge E, FNP   cefdinir (OMNICEF) 300 MG capsule Take 1 capsule (300 mg total) by mouth 2 (two) times daily for 10 days. 20 capsule Lance Muss, FNP      PDMP not reviewed this encounter.   Lance Muss, FNP 10/06/21 1955

## 2021-10-06 NOTE — Discharge Instructions (Addendum)
You are being treated with cefdinir antibiotic to treat ear infection and upper respiratory infection.  Rapid strep test was negative.  Throat culture and COVID-19 viral swab are pending.  We will call if they are positive.  You may also take over-the-counter medications help alleviate cough and congestion.

## 2021-10-08 LAB — NOVEL CORONAVIRUS, NAA

## 2021-10-10 LAB — CULTURE, GROUP A STREP (THRC)

## 2022-03-29 ENCOUNTER — Ambulatory Visit
Admission: RE | Admit: 2022-03-29 | Discharge: 2022-03-29 | Disposition: A | Discharge status: Home / Self Care | Payer: Managed Care, Other (non HMO) | Source: Ambulatory Visit | Attending: Family Medicine | Admitting: Family Medicine

## 2022-03-29 VITALS — BP 121/82 | HR 75 | Temp 97.9°F | Resp 18

## 2022-03-29 DIAGNOSIS — J019 Acute sinusitis, unspecified: Secondary | ICD-10-CM

## 2022-03-29 DIAGNOSIS — J4541 Moderate persistent asthma with (acute) exacerbation: Secondary | ICD-10-CM

## 2022-03-29 DIAGNOSIS — H6122 Impacted cerumen, left ear: Secondary | ICD-10-CM | POA: Diagnosis not present

## 2022-03-29 MED ORDER — PREDNISONE 20 MG PO TABS: 40.0000 mg | ORAL_TABLET | Freq: Every day | ORAL | 0 refills | Status: AC

## 2022-03-29 MED ORDER — NEOMYCIN-POLYMYXIN-HC 3.5-10000-1 OT SOLN: 3.0000 [drp] | Freq: Four times a day (QID) | OTIC | 0 refills | Status: AC

## 2022-03-29 MED ORDER — AMOXICILLIN-POT CLAVULANATE 875-125 MG PO TABS: 1.0000 | ORAL_TABLET | Freq: Two times a day (BID) | ORAL | 0 refills | Status: AC

## 2022-03-29 NOTE — ED Triage Notes (Signed)
Pt c/o cough, sore throat, hearing loss left ear, nasal drainage, body aches  ? ?Denies headache, nausea, vomiting, diarrhea, constipation  ? ?Onset ~ "a couple of weeks ago" ?

## 2022-03-29 NOTE — ED Provider Notes (Addendum)
?[name] ? ? ? ?CSN: [id number] ?Arrival date & time: 03/29/22  1342 ? ? ?  ? ?History   ?Chief Complaint ?Chief Complaint  ?Patient presents with  ? Cough  ?  Entered by patient  ? ? ?HPI ?Debbie Contreras is a 41 y.o. female.  ? ? ?Cough ?Here for cough and increased nasal congestion for about 2 weeks.  No fever or chills.  No vomiting or diarrhea, except she did have 1 episode of posttussive emesis yesterday.  She has chronic nasal congestion from nasal polyps, but it is worse right now.  She has also been wheezing more with her asthma in the last couple of weeks. ? ?No ear pain but she is having a hard time hearing out of her left ear ? ?Past Medical History:  ?Diagnosis Date  ? Asthma   ? ? ?There are no problems to display for this patient. ? ? ?Past Surgical History:  ?Procedure Laterality Date  ? CESAREAN SECTION    ? TONSILLECTOMY    ? age 74  ? ? ?OB History   ?No obstetric history on file. ?  ? ? ? ?Home Medications   ? ?Prior to Admission medications   ?Medication Sig Start Date End Date Taking? Authorizing Provider  ?amoxicillin-clavulanate (AUGMENTIN) 875-125 MG tablet Take 1 tablet by mouth 2 (two) times daily for 7 days. 03/29/22 04/05/22 Yes Saysha Menta, Gwenlyn Perking, MD  ?neomycin-polymyxin-hydrocortisone (CORTISPORIN) OTIC solution Place 3 drops into the left ear 4 (four) times daily for 7 days. 03/29/22 04/05/22 Yes Lindi Abram, Gwenlyn Perking, MD  ?predniSONE (DELTASONE) 20 MG tablet Take 2 tablets (40 mg total) by mouth daily with breakfast for 5 days. 03/29/22 04/03/22 Yes Genise Strack, Gwenlyn Perking, MD  ?albuterol (VENTOLIN HFA) 108 (90 Base) MCG/ACT inhaler Inhale 2 puffs into the lungs every 6 (six) hours as needed for wheezing or shortness of breath.     [provider]  ?budesonide-formoterol (SYMBICORT) 160-4.5 MCG/ACT inhaler Inhale 2 puffs into the lungs 2 (two) times daily.    [provider]  ?cetirizine (ZYRTEC) 10 MG tablet Take 10 mg by mouth daily as needed for allergies.     [provider]  ?diphenhydrAMINE (BENADRYL) 25 MG tablet Take 25 mg by mouth at bedtime as needed for sleep.    [provider]  ?fluticasone (FLONASE) 50 MCG/ACT nasal spray Place 1 spray into both nostrils daily. ?Patient taking differently: Place 2 sprays into both nostrils daily as needed for allergies. 03/06/20   Hall-Potvin, [place], PA-C  ?montelukast (SINGULAIR) 10 MG tablet Take 1 tablet (10 mg total) by mouth at bedtime. ?Patient taking differently: Take 10 mg by mouth daily as needed (allergies). 03/06/20   Hall-Potvin, [place], PA-C  ? ? ?Family History ?Family History  ?Problem Relation Age of Onset  ? Cervical cancer Mother   ? COPD Father   ? ? ?Social History ?Social History  ? ?Tobacco Use  ? Smoking status: Never  ? Smokeless tobacco: Never  ?Vaping Use  ? Vaping Use: Never used  ?Substance Use Topics  ? Alcohol use: Not Currently  ? Drug use: Never  ? ? ? ?Allergies   ?Patient has no known allergies. ? ? ?Review of Systems ?Review of Systems  ?Respiratory:  Positive for cough.   ? ? ?Physical Exam ?Triage Vital Signs ?ED Triage Vitals [03/29/22 1421]  ?Enc Vitals Group  ?   BP 121/82  ?   Pulse Rate 75  ?   Resp 18  ?  Temp 97.9 ?F (36.6 ?C)  ?   Temp Source Oral  ?   SpO2 97 %  ?   Weight   ?   Height   ?   Head Circumference   ?   Peak Flow   ?   Pain Score 0  ?   Pain Loc   ?   Pain Edu?   ?   Excl. in [place]?   ? ?No data found. ? ?Updated Vital Signs ?BP 121/82 (BP Location: Left Arm)   Pulse 75   Temp 97.9 ?F (36.6 ?C) (Oral)   Resp 18   SpO2 97%  ? ?Visual Acuity ?Right Eye Distance:   ?Left Eye Distance:   ?Bilateral Distance:   ? ?Right Eye Near:   ?Left Eye Near:    ?Bilateral Near:    ? ?Physical Exam ?Vitals reviewed.  ?Constitutional:   ?   General: She is not in acute distress. ?   Appearance: She is not toxic-appearing.  ?HENT:  ?   Right Ear: Tympanic membrane and ear canal normal.  ?   Ears:  ?   Comments: Left tympanic membrane is obscured by dark brown  cerumen ?   Nose: Nose normal.  ?   Mouth/Throat:  ?   Mouth: Mucous membranes are moist.  ?   Pharynx: No oropharyngeal exudate or posterior oropharyngeal erythema.  ?Eyes:  ?   Extraocular Movements: Extraocular movements intact.  ?   Conjunctiva/sclera: Conjunctivae normal.  ?   Pupils: Pupils are equal, round, and reactive to light.  ?Cardiovascular:  ?   Rate and Rhythm: Normal rate and regular rhythm.  ?   Heart sounds: No murmur heard. ?Pulmonary:  ?   Effort: Pulmonary effort is normal. No respiratory distress.  ?   Breath sounds: No wheezing, rhonchi or rales.  ?Chest:  ?   Chest wall: No tenderness.  ?Musculoskeletal:  ?   Cervical back: Neck supple.  ?Lymphadenopathy:  ?   Cervical: No cervical adenopathy.  ?Skin: ?   Capillary Refill: Capillary refill takes less than 2 seconds.  ?   Coloration: Skin is not jaundiced or pale.  ?Neurological:  ?   General: No focal deficit present.  ?   Mental Status: She is alert and oriented to person, place, and time.  ?Psychiatric:     ?   Behavior: Behavior normal.  ? ? ? ?UC Treatments / Results  ?Labs ?(all labs ordered are listed, but only abnormal results are displayed) ?Labs Reviewed - No data to display ? ?EKG ? ? ?Radiology ?No results found. ? ?Procedures ?Procedures (including critical care time) ? ?Medications Ordered in UC ?Medications - No data to display ? ?Initial Impression / Assessment and Plan / UC Course  ?I have reviewed the triage vital signs and the nursing notes. ? ?Pertinent labs & imaging results that were available during my care of the patient were reviewed by me and considered in my medical decision making (see chart for details). ? ?  ? ?We will treat for asthma exacerbation and acute sinusitis.  Also lavage will be done of the left ear ? ?With the length of symptoms I am not going to swab for COVID ? ? ?After ear lavage, the canal was noted to be erythematous with white dc in the canal, so cortisporin also sent in ?Final Clinical  Impressions(s) / UC Diagnoses  ? ?Final diagnoses:  ?Acute sinusitis, recurrence not specified, unspecified location  ?Moderate persistent asthma with acute  exacerbation  ? ? ? ?Discharge Instructions   ? ?  ?Take amoxicillin-clavulanic 875 mg--1 tab twice daily with food for 7 days ? ?Take prednisone 20 mg--2 tabs daily for 5 days ? ?Continue your Flonase daily in your nose ? ?Continue the Symbicort and try to use it twice daily while you are feeling worse. ? ?Also continue using your albuterol as needed ? ? ? ? ?ED Prescriptions   ? ? Medication Sig Dispense Auth. Provider  ? amoxicillin-clavulanate (AUGMENTIN) 875-125 MG tablet Take 1 tablet by mouth 2 (two) times daily for 7 days. 14 tablet Genise Strack, Gwenlyn Perking, MD  ? predniSONE (DELTASONE) 20 MG tablet Take 2 tablets (40 mg total) by mouth daily with breakfast for 5 days. 10 tablet Barrett Henle, MD  ? neomycin-polymyxin-hydrocortisone (CORTISPORIN) OTIC solution Place 3 drops into the left ear 4 (four) times daily for 7 days. 10 mL Barrett Henle, MD  ? ?  ? ?PDMP not reviewed this encounter. ?  ?Barrett Henle, MD ?03/29/22 1449 ? ?  ?Barrett Henle, MD ?03/29/22 1504 ? ?

## 2022-03-29 NOTE — Discharge Instructions (Addendum)
Take amoxicillin-clavulanic 875 mg--1 tab twice daily with food for 7 days ? ?Take prednisone 20 mg--2 tabs daily for 5 days ? ?Continue your Flonase daily in your nose ? ?Continue the Symbicort and try to use it twice daily while you are feeling worse. ? ?Also continue using your albuterol as needed ?

## 2022-10-14 ENCOUNTER — Ambulatory Visit
Admission: RE | Admit: 2022-10-14 | Discharge: 2022-10-14 | Disposition: A | Discharge status: Home / Self Care | Payer: BLUE CROSS/BLUE SHIELD | Source: Ambulatory Visit | Attending: Urgent Care | Admitting: Urgent Care

## 2022-10-14 VITALS — BP 126/70 | HR 92 | Temp 98.1°F | Resp 18

## 2022-10-14 DIAGNOSIS — J4541 Moderate persistent asthma with (acute) exacerbation: Secondary | ICD-10-CM

## 2022-10-14 MED ORDER — PREDNISONE 10 MG PO TABS: 40.0000 mg | ORAL_TABLET | Freq: Every day | ORAL | 0 refills | Status: AC

## 2022-10-14 MED ORDER — METHYLPREDNISOLONE SODIUM SUCC 40 MG IJ SOLR: 40.0000 mg | Freq: Once | INTRAMUSCULAR | 0 refills | Status: DC

## 2022-10-14 MED ORDER — PREDNISONE 10 MG PO TABS: 20.0000 mg | ORAL_TABLET | Freq: Every day | ORAL | 0 refills | Status: DC

## 2022-10-14 MED ORDER — METHYLPREDNISOLONE SODIUM SUCC 40 MG IJ SOLR
40.0000 mg | Freq: Once | INTRAMUSCULAR | Status: AC
  Administered 2022-10-14: 40 mg via INTRAMUSCULAR

## 2022-10-14 NOTE — ED Provider Notes (Signed)
[name]    CSN: [id number] Arrival date & time: 10/14/22  0801      History   Chief Complaint Chief Complaint  Patient presents with   Cough    Entered by patient    HPI Debbie Contreras is a 41 y.o. female.    Cough   Patient presents to UC with complaint of exacerbation of asthma accompanied by cough x2 days.  She reports SOB and wheezing.  Using Mucinex as well as prescribed albuterol and Symbicort inhaler.  Patient reports that her son is experiencing a URI and she believes she has "caught it" from him.  She wonders if either or both of them need an antibiotic for treatment.  Patient has history of asthma with prescription of Symbicort 2 puffs twice daily.  She reports her asthma is well controlled at baseline with use of albuterol inhaler rarely.  Patient has not used albuterol since her symptoms began 2 days ago.  She states the Symbicort is not working and so she thought she needed to be seen.  Past Medical History:  Diagnosis Date   Asthma     There are no problems to display for this patient.   Past Surgical History:  Procedure Laterality Date   CESAREAN SECTION     TONSILLECTOMY     age 53    OB History   No obstetric history on file.      Home Medications    Prior to Admission medications   Medication Sig Start Date End Date Taking? Authorizing Provider  albuterol (VENTOLIN HFA) 108 (90 Base) MCG/ACT inhaler Inhale 2 puffs into the lungs every 6 (six) hours as needed for wheezing or shortness of breath.     [provider]  budesonide-formoterol (SYMBICORT) 160-4.5 MCG/ACT inhaler Inhale 2 puffs into the lungs 2 (two) times daily.    [provider]  cetirizine (ZYRTEC) 10 MG tablet Take 10 mg by mouth daily as needed for allergies.    [provider]  diphenhydrAMINE (BENADRYL) 25 MG tablet Take 25 mg by mouth at bedtime as needed for sleep.    [provider]  fluticasone (FLONASE) 50 MCG/ACT  nasal spray Place 1 spray into both nostrils daily. Patient taking differently: Place 2 sprays into both nostrils daily as needed for allergies. 03/06/20   Hall-Potvin, [place], PA-C  montelukast (SINGULAIR) 10 MG tablet Take 1 tablet (10 mg total) by mouth at bedtime. Patient taking differently: Take 10 mg by mouth daily as needed (allergies). 03/06/20   Hall-Potvin, [place], PA-C    Family History Family History  Problem Relation Age of Onset   Cervical cancer Mother    COPD Father     Social History Social History   Tobacco Use   Smoking status: Never   Smokeless tobacco: Never  Vaping Use   Vaping Use: Never used  Substance Use Topics   Alcohol use: Not Currently   Drug use: Never     Allergies   Patient has no known allergies.   Review of Systems Review of Systems  Respiratory:  Positive for cough.      Physical Exam Triage Vital Signs ED Triage Vitals  Enc Vitals Group     BP      Pulse      Resp      Temp      Temp src      SpO2      Weight      Height  Head Circumference      Peak Flow      Pain Score      Pain Loc      Pain Edu?      Excl. in GC?    No data found.  Updated Vital Signs There were no vitals taken for this visit.  Visual Acuity Right Eye Distance:   Left Eye Distance:   Bilateral Distance:    Right Eye Near:   Left Eye Near:    Bilateral Near:     Physical Exam Vitals reviewed.  Constitutional:      Appearance: Normal appearance.  Cardiovascular:     Rate and Rhythm: Normal rate and regular rhythm.     Pulses: Normal pulses.     Heart sounds: Normal heart sounds.  Pulmonary:     Effort: Accessory muscle usage present.     Breath sounds: Decreased air movement and transmitted upper airway sounds present. Examination of the right-upper field reveals wheezing. Examination of the left-upper field reveals wheezing. Examination of the right-middle field reveals wheezing. Examination of the left-middle field reveals  wheezing. Examination of the right-lower field reveals wheezing. Examination of the left-lower field reveals wheezing. Decreased breath sounds and wheezing present.  Neurological:     Mental Status: She is alert.      UC Treatments / Results  Labs (all labs ordered are listed, but only abnormal results are displayed) Labs Reviewed - No data to display  EKG   Radiology No results found.  Procedures Procedures (including critical care time)  Medications Ordered in UC Medications - No data to display  Initial Impression / Assessment and Plan / UC Course  I have reviewed the triage vital signs and the nursing notes.  Pertinent labs & imaging results that were available during my care of the patient were reviewed by me and considered in my medical decision making (see chart for details).   Severe asthma symptoms today.  Will prescribe corticosteroid injection here in the office with a steroid taper at home.  Educated patient on proper use of albuterol as "rescue" inhaler.  She seems to be unaware that albuterol serves this purpose as she has not used it since her symptoms began yesterday.  Recommended she follow-up with her primary care provider regarding baseline treatment of asthma.  Final Clinical Impressions(s) / UC Diagnoses   Final diagnoses:  None   Discharge Instructions   None    ED Prescriptions   None    PDMP not reviewed this encounter.   Charma Igo, [place] 10/14/22 [phone]

## 2022-10-14 NOTE — ED Triage Notes (Signed)
Pt presents with sob and wheezing, coughing since yesterday, pmh of asthma and concern for uri since her son has one currently. Pt reports taking musinex, and albuterol and Symbicort inhaler.

## 2022-10-14 NOTE — Discharge Instructions (Addendum)
Follow up here or with your primary care provider if your symptoms are worsening or not improving with treatment.     

## 2022-10-25 HISTORY — PX: LAPAROSCOPIC HYSTERECTOMY: SHX1926

## 2022-11-01 DIAGNOSIS — Z90710 Acquired absence of both cervix and uterus: Secondary | ICD-10-CM | POA: Insufficient documentation

## 2023-05-10 ENCOUNTER — Ambulatory Visit
Admission: EM | Admit: 2023-05-10 | Discharge: 2023-05-10 | Disposition: A | Discharge status: Home / Self Care | Payer: BLUE CROSS/BLUE SHIELD | Attending: Family Medicine | Admitting: Family Medicine

## 2023-05-10 DIAGNOSIS — J4541 Moderate persistent asthma with (acute) exacerbation: Secondary | ICD-10-CM

## 2023-05-10 DIAGNOSIS — J019 Acute sinusitis, unspecified: Secondary | ICD-10-CM | POA: Diagnosis not present

## 2023-05-10 HISTORY — DX: Iron deficiency: E61.1

## 2023-05-10 MED ORDER — PREDNISONE 20 MG PO TABS: ORAL_TABLET | ORAL | 0 refills | Status: DC

## 2023-05-10 MED ORDER — CEFDINIR 300 MG PO CAPS: 600.0000 mg | ORAL_CAPSULE | Freq: Every day | ORAL | 0 refills | Status: AC

## 2023-05-10 NOTE — ED Triage Notes (Signed)
Pt presents with SOB x 1 month. Has been taking OTC meds for cold/flu/sinus.  SOB with any exertion. Reports it as asthma but also has sinus pressure, nasal congestion, cough x 1 month.  Using symbicort BID with no relief. Albuterol inhaler and albuterol nebulizer helps for about 1 hour.   Took alb inhaler approx 1 hour PTA, has not had nebulizer tx today.

## 2023-05-10 NOTE — Discharge Instructions (Signed)
Take cefdinir 300 mg--2 capsules together daily for 7 days  Take prednisone 20 mg--3 tabs daily x 2 days, then 2 tabs daily x 2 days, then 1 tab daily x 2 days, then one half tab daily x 2 days, then stop   Continue your albuterol as needed.  Follow-up with your primary care, especially if not improving

## 2023-05-10 NOTE — ED Provider Notes (Signed)
[name]    CSN: [id number] Arrival date & time: 05/10/23  1128      History   Chief Complaint Chief Complaint  Patient presents with   Shortness of Breath    HPI Debbie Contreras is a 42 y.o. female.    Shortness of Breath  Here for shortness of breath and nasal congestion and cough.  Symptoms have been going on for about 1 month.  She has had some sinus pressure also.  No fever that she notes at any point, no nausea vomiting or diarrhea.  She has been using her albuterol inhaler and nebulizer and also Symbicort twice daily.  She is just not been improving.  She has NOT had any acute worsening in these last few days.  No known drug allergies.  She has had a hysterectomy    Past Medical History:  Diagnosis Date   Asthma    Iron deficiency     There are no problems to display for this patient.   Past Surgical History:  Procedure Laterality Date   CESAREAN SECTION     LAPAROSCOPIC HYSTERECTOMY  10/2022   TONSILLECTOMY     age 38    OB History   No obstetric history on file.      Home Medications    Prior to Admission medications   Medication Sig Start Date End Date Taking? Authorizing Provider  albuterol (2.5 MG/3ML) 0.083% NEBU 3 mL, albuterol (5 MG/ML) 0.5% NEBU 0.5 mL Take by nebulization.   Yes [provider]  albuterol (VENTOLIN HFA) 108 (90 Base) MCG/ACT inhaler Inhale 2 puffs into the lungs every 6 (six) hours as needed for wheezing or shortness of breath.    Yes [provider]  budesonide-formoterol (SYMBICORT) 160-4.5 MCG/ACT inhaler Inhale 2 puffs into the lungs 2 (two) times daily.   Yes [provider]  cefdinir (OMNICEF) 300 MG capsule Take 2 capsules (600 mg total) by mouth daily for 7 days. 05/10/23 05/17/23 Yes Zenia Resides, MD  diphenhydrAMINE (BENADRYL) 25 MG tablet Take 25 mg by mouth at bedtime as needed for sleep.   Yes [provider]  ferrous sulfate 324 MG TBEC Take 324 mg by  mouth.   Yes [provider]  predniSONE (DELTASONE) 20 MG tablet 3 tabs daily x 2 days, then 2 tabs daily x 2 days, then 1 tab daily x 2 days, then one half tab daily x 2 days, then stop 05/10/23  Yes Tanvir Hipple, Janace Aris, MD  fluticasone (FLONASE) 50 MCG/ACT nasal spray Place 1 spray into both nostrils daily. Patient taking differently: Place 2 sprays into both nostrils daily as needed for allergies. 03/06/20   Hall-Potvin, [place], PA-C    Family History Family History  Problem Relation Age of Onset   Cervical cancer Mother    COPD Father     Social History Social History   Tobacco Use   Smoking status: Never   Smokeless tobacco: Never  Vaping Use   Vaping Use: Never used  Substance Use Topics   Alcohol use: Not Currently   Drug use: Never     Allergies   Patient has no known allergies.   Review of Systems Review of Systems  Respiratory:  Positive for shortness of breath.      Physical Exam Triage Vital Signs ED Triage Vitals  Enc Vitals Group     BP 05/10/23 1146 (!) 140/95     Pulse Rate 05/10/23 1146 76     Resp 05/10/23  1146 20     Temp 05/10/23 1146 97.9 F (36.6 C)     Temp Source 05/10/23 1146 Oral     SpO2 05/10/23 1146 93 %     Weight --      Height --      Head Circumference --      Peak Flow --      Pain Score 05/10/23 1139 0     Pain Loc --      Pain Edu? --      Excl. in GC? --    No data found.  Updated Vital Signs BP (!) 140/95 (BP Location: Left Arm)   Pulse 76   Temp 97.9 F (36.6 C) (Oral)   Resp 20   LMP  (Exact Date)   SpO2 93%   Visual Acuity Right Eye Distance:   Left Eye Distance:   Bilateral Distance:    Right Eye Near:   Left Eye Near:    Bilateral Near:     Physical Exam Vitals reviewed.  Constitutional:      General: She is not in acute distress.    Appearance: She is not toxic-appearing.  HENT:     Nose: Congestion present.     Mouth/Throat:     Mouth: Mucous membranes are moist.     Comments:  No erythema.  There is lots of air and white mucus draining Eyes:     Extraocular Movements: Extraocular movements intact.     Conjunctiva/sclera: Conjunctivae normal.     Pupils: Pupils are equal, round, and reactive to light.  Cardiovascular:     Rate and Rhythm: Normal rate and regular rhythm.     Heart sounds: No murmur heard. Pulmonary:     Effort: No respiratory distress.     Breath sounds: No stridor. No rhonchi or rales.     Comments: There is expiratory wheezing with fairly good air movement.  No rales or rhonchi Musculoskeletal:     Cervical back: Neck supple.  Lymphadenopathy:     Cervical: No cervical adenopathy.  Skin:    Capillary Refill: Capillary refill takes less than 2 seconds.     Coloration: Skin is not jaundiced or pale.  Neurological:     General: No focal deficit present.     Mental Status: She is alert and oriented to person, place, and time.  Psychiatric:        Behavior: Behavior normal.      UC Treatments / Results  Labs (all labs ordered are listed, but only abnormal results are displayed) Labs Reviewed - No data to display  EKG   Radiology No results found.  Procedures Procedures (including critical care time)  Medications Ordered in UC Medications - No data to display  Initial Impression / Assessment and Plan / UC Course  I have reviewed the triage vital signs and the nursing notes.  Pertinent labs & imaging results that were available during my care of the patient were reviewed by me and considered in my medical decision making (see chart for details).        She states she has adequate supply of albuterol in both forms.  Prednisone is sent in to treat asthma exacerbation and Omnicef is sent in to treat sinus infection.  I did a little short taper of the prednisone since she has been sick for so long.  Final Clinical Impressions(s) / UC Diagnoses   Final diagnoses:  Moderate persistent asthma with acute exacerbation  Acute  sinusitis, recurrence  not specified, unspecified location     Discharge Instructions      Take cefdinir 300 mg--2 capsules together daily for 7 days  Take prednisone 20 mg--3 tabs daily x 2 days, then 2 tabs daily x 2 days, then 1 tab daily x 2 days, then one half tab daily x 2 days, then stop   Continue your albuterol as needed.  Follow-up with your primary care, especially if not improving     ED Prescriptions     Medication Sig Dispense Auth. Provider   cefdinir (OMNICEF) 300 MG capsule Take 2 capsules (600 mg total) by mouth daily for 7 days. 14 capsule Maiko Salais, Janace Aris, MD   predniSONE (DELTASONE) 20 MG tablet 3 tabs daily x 2 days, then 2 tabs daily x 2 days, then 1 tab daily x 2 days, then one half tab daily x 2 days, then stop 13 tablet Iqra Rotundo, Janace Aris, MD      PDMP not reviewed this encounter.   Zenia Resides, MD 05/10/23 1201

## 2024-03-27 ENCOUNTER — Ambulatory Visit: Admission: EM | Admit: 2024-03-27 | Discharge: 2024-03-27 | Disposition: A | Discharge status: Home / Self Care

## 2024-03-27 DIAGNOSIS — J45901 Unspecified asthma with (acute) exacerbation: Secondary | ICD-10-CM

## 2024-03-27 MED ORDER — AZITHROMYCIN 250 MG PO TABS: 250.0000 mg | ORAL_TABLET | Freq: Every day | ORAL | 0 refills | Status: DC

## 2024-03-27 MED ORDER — IPRATROPIUM-ALBUTEROL 0.5-2.5 (3) MG/3ML IN SOLN
3.0000 mL | Freq: Once | RESPIRATORY_TRACT | Status: AC
  Administered 2024-03-27: 3 mL via RESPIRATORY_TRACT

## 2024-03-27 MED ORDER — AZITHROMYCIN 250 MG PO TABS: 250.0000 mg | ORAL_TABLET | Freq: Every day | ORAL | 0 refills | Status: AC

## 2024-03-27 NOTE — ED Triage Notes (Signed)
"  I have these polyps in my nose with upcoming surgery in a few wks, they gave me some prednisone (taper dose) that didn't work well. This congestion/inability to breath out of my nose has caused me to have an asthma attack/continued exacerbation". No fever.

## 2024-03-27 NOTE — ED Provider Notes (Signed)
 [name]    CSN: [id number] Arrival date & time: 03/27/24  1205      History   Chief Complaint Chief Complaint  Patient presents with   Asthma Exacerbation    HPI Debbie Contreras is a 43 y.o. female.   Patient here today for evaluation of congestion, shortness of breath and cough that started over the last few weeks.  She reports that she does have known nasal polyps and is to have these removed in the near future.  She notes that she was prescribed a steroid taper that she finished a few days ago but did not feel this was helpful.  She states normally she has an antibiotic prescribed with steroids but was not prescribed these at this time.  She denies any fever.  The history is provided by the patient.    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      Home Medications    Prior to Admission medications   Medication Sig Start Date End Date Taking? Authorizing Provider  acetaminophen (TYLENOL) 325 MG tablet Take 650 mg by mouth every 6 (six) hours as needed.   Yes [provider]  albuterol (2.5 MG/3ML) 0.083% NEBU 3 mL, albuterol (5 MG/ML) 0.5% NEBU 0.5 mL Take by nebulization.   Yes [provider]  albuterol (VENTOLIN HFA) 108 (90 Base) MCG/ACT inhaler Inhale 2 puffs into the lungs every 6 (six) hours as needed for wheezing or shortness of breath.    Yes [provider]  budesonide-formoterol (SYMBICORT) 160-4.5 MCG/ACT inhaler Inhale 2 puffs into the lungs 2 (two) times daily.   Yes [provider]  diphenhydrAMINE (BENADRYL) 25 MG tablet Take 25 mg by mouth at bedtime as needed for sleep.   Yes [provider]  ferrous sulfate 324 MG TBEC Take 324 mg by mouth.   Yes [provider]  fluticasone (FLONASE) 50 MCG/ACT nasal spray Place 1 spray into both nostrils daily. Patient taking differently: Place 2 sprays into both nostrils daily as needed for allergies. 03/06/20  Yes Hall-Potvin, [place], PA-C  HYDROcodone-acetaminophen (NORCO/VICODIN) 5-325 MG tablet Take 1 tablet by mouth every 6 (six) hours as needed. 10/31/22  Yes [provider]  azithromycin (ZITHROMAX) 250 MG tablet Take 1 tablet (250 mg total) by mouth daily. Take first 2 tablets together, then 1 every day until finished. 03/27/24   Tomi Bamberger, PA-C  Docusate Sodium (DSS) 100 MG CAPS Take 100 mg by mouth 2 (two) times daily. 10/24/22   [provider]    Family History Family History  Problem Relation Age of Onset   Cervical cancer Mother    COPD Father     Social History Social History   Tobacco Use   Smoking status: Former    Types: Cigarettes   Smokeless tobacco: Never  Vaping Use   Vaping status: Never Used  Substance Use Topics   Alcohol use: Not Currently   Drug use: Never  Allergies   Patient has no known allergies.   Review of Systems Review of Systems  Constitutional:  Negative for chills and fever.  HENT:  Positive for congestion. Negative for sore throat.   Eyes:  Negative for discharge and redness.  Respiratory:  Positive for cough. Negative for shortness of breath and wheezing.   Gastrointestinal:  Negative for abdominal pain, diarrhea, nausea and vomiting.     Physical Exam Triage Vital Signs ED Triage Vitals  Encounter Vitals Group     BP 03/27/24 1214 107/66     Systolic BP Percentile --      Diastolic BP Percentile --      Pulse Rate 03/27/24 1214 96     Resp 03/27/24 1214 (!) 24     Temp 03/27/24 1214 98 F (36.7 C)      Temp Source 03/27/24 1214 Oral     SpO2 03/27/24 1214 94 %     Weight 03/27/24 1219 200 lb (90.7 kg)     Height 03/27/24 1219 5\' 1"  (1.549 m)     Head Circumference --      Peak Flow --      Pain Score 03/27/24 1219 0     Pain Loc --      Pain Education --      Exclude from Growth Chart --    No data found.  Updated Vital Signs BP 107/66   Pulse 88   Temp 98 F (36.7 C) (Oral)   Resp 20   Ht 5\' 1"  (1.549 m)   Wt 200 lb (90.7 kg)   LMP  (Exact Date)   SpO2 99%   BMI 37.79 kg/m   Visual Acuity Right Eye Distance:   Left Eye Distance:   Bilateral Distance:    Right Eye Near:   Left Eye Near:    Bilateral Near:     Physical Exam Vitals and nursing note reviewed.  Constitutional:      General: She is not in acute distress.    Appearance: Normal appearance. She is not ill-appearing.  HENT:     Head: Normocephalic and atraumatic.     Right Ear: Tympanic membrane normal.     Left Ear: Tympanic membrane normal.     Nose: Congestion present.     Mouth/Throat:     Mouth: Mucous membranes are moist.     Pharynx: No oropharyngeal exudate or posterior oropharyngeal erythema.  Eyes:     Conjunctiva/sclera: Conjunctivae normal.  Cardiovascular:     Rate and Rhythm: Normal rate and regular rhythm.     Heart sounds: Normal heart sounds. No murmur heard. Pulmonary:     Effort: Pulmonary effort is normal. No respiratory distress.     Breath sounds: Normal breath sounds. No wheezing, rhonchi or rales.  Skin:    General: Skin is warm and dry.  Neurological:     Mental Status: She is alert.  Psychiatric:        Mood and Affect: Mood normal.        Thought Content: Thought content normal.      UC Treatments / Results  Labs (all labs ordered are listed, but only abnormal results are displayed) Labs Reviewed - No data to display  EKG   Radiology No results found.  Procedures Procedures (including critical care time)  Medications Ordered in UC Medications   ipratropium-albuterol (DUONEB) 0.5-2.5 (3) MG/3ML nebulizer solution 3 mL (3 mLs Nebulization Given 03/27/24 1248)    Initial Impression / Assessment and  Plan / UC Course  I have reviewed the triage vital signs and the nursing notes.  Pertinent labs & imaging results that were available during my care of the patient were reviewed by me and considered in my medical decision making (see chart for details).     Discussed possibility of continued asthma exacerbation and will treat with Z-Pak.  Recommended she use her albuterol.  DuoNeb administered in office with some relief.  She will continue her Symbicort daily.   Final Clinical Impressions(s) / UC Diagnoses   Final diagnoses:  Asthma with acute exacerbation, unspecified asthma severity, unspecified whether persistent   Discharge Instructions   None    ED Prescriptions     Medication Sig Dispense Auth. Provider   azithromycin (ZITHROMAX) 250 MG tablet  (Status: Discontinued) Take 1 tablet (250 mg total) by mouth daily. Take first 2 tablets together, then 1 every day until finished. 6 tablet Erma Pinto F, PA-C   azithromycin (ZITHROMAX) 250 MG tablet Take 1 tablet (250 mg total) by mouth daily. Take first 2 tablets together, then 1 every day until finished. 6 tablet Tomi Bamberger, PA-C      PDMP not reviewed this encounter.   Tomi Bamberger, PA-C 03/27/24 1751

## 2024-03-27 NOTE — ED Notes (Signed)
 Treatment Done.

## 2024-04-23 ENCOUNTER — Encounter: Payer: Self-pay | Admitting: Emergency Medicine

## 2024-04-23 ENCOUNTER — Ambulatory Visit: Admission: EM | Admit: 2024-04-23 | Discharge: 2024-04-23 | Disposition: A | Discharge status: Home / Self Care

## 2024-04-23 DIAGNOSIS — R112 Nausea with vomiting, unspecified: Secondary | ICD-10-CM | POA: Diagnosis not present

## 2024-04-23 DIAGNOSIS — A084 Viral intestinal infection, unspecified: Secondary | ICD-10-CM | POA: Diagnosis not present

## 2024-04-23 MED ORDER — ONDANSETRON 4 MG PO TBDP
4.0000 mg | ORAL_TABLET | Freq: Once | ORAL | Status: AC
  Administered 2024-04-23: 4 mg via ORAL

## 2024-04-23 MED ORDER — ONDANSETRON HCL 4 MG/2ML IJ SOLN
4.0000 mg | Freq: Once | INTRAMUSCULAR | Status: AC
  Administered 2024-04-23: 4 mg via INTRAMUSCULAR

## 2024-04-23 MED ORDER — ONDANSETRON 4 MG PO TBDP: 4.0000 mg | ORAL_TABLET | Freq: Three times a day (TID) | ORAL | 0 refills | Status: AC | PRN

## 2024-04-23 NOTE — Discharge Instructions (Signed)

## 2024-04-23 NOTE — ED Triage Notes (Signed)
 Pt reports N/V and diarrhea symptoms that started last night around 10pm. Denies fevers. Last emesis episode: 30 min, 6-7 episodes total. 6 loose stools total. Has not been able to keep anything down. No sick contacts. Pt concerned for food poisoning from [place] last night.

## 2024-04-23 NOTE — ED Provider Notes (Signed)
 [name]    CSN: [id number] Arrival date & time: 04/23/24  4098      History   Chief Complaint Chief Complaint  Patient presents with   Nausea   Emesis   Diarrhea   Abdominal Pain    HPI Debbie Contreras is a 43 y.o. female.   Debbie Contreras is a 43 y.o. female presenting for chief complaint of Nausea, Emesis, Diarrhea, and Abdominal Pain that started at 10 PM last night (12 hours ago).  She ate [place] food leftovers that she heated up in the microwave at 5 PM yesterday, then nausea vomiting and diarrhea started 5 hours later.  She ate the same food the night prior without any problem or symptoms.  No other recent sick contacts with similar symptoms or recent intake of foods outside of normal diet.  She has had 6 or 7 episodes of nonbloody/nonbilious emesis over the last 12 hours and multiple episodes of nonbloody diarrhea.  Reports generalized abdominal pain that is worse with diarrhea episodes.  History of cesarean section and laparoscopic hysterectomy, otherwise no history of cholecystectomy/appendectomy.  Denies recent antibiotic or steroid use.  No recent travel, tick bites, or fever/chills.  Denies viral URI symptoms, urinary symptoms, flank pain, dizziness, and syncope.  She has not attempted use of any over-the-counter medications to help with symptoms before arrival.   Emesis Associated symptoms: abdominal pain and diarrhea   Diarrhea Associated symptoms: abdominal pain and vomiting   Abdominal Pain Associated symptoms: diarrhea and vomiting     Past Medical History:  Diagnosis Date   Asthma    Iron deficiency     Patient Active Problem List   Diagnosis Date Noted   History of robot-assisted laparoscopic hysterectomy 11/01/2022   Chronic pansinusitis 03/30/2020   Nasal polyps 03/15/2020   Asthma 03/15/2020   Anosmia 03/15/2020    Past Surgical History:  Procedure Laterality Date   CESAREAN SECTION     LAPAROSCOPIC HYSTERECTOMY  10/2022    TONSILLECTOMY     age 38    OB History   No obstetric history on file.      Home Medications    Prior to Admission medications   Medication Sig Start Date End Date Taking? Authorizing Provider  albuterol  (2.5 MG/3ML) 0.083% NEBU 3 mL, albuterol  (5 MG/ML) 0.5% NEBU 0.5 mL Take by nebulization.   Yes [provider]  albuterol  (VENTOLIN  HFA) 108 (90 Base) MCG/ACT inhaler Inhale 2 puffs into the lungs every 6 (six) hours as needed for wheezing or shortness of breath.    Yes [provider]  budesonide-formoterol (SYMBICORT) 160-4.5 MCG/ACT inhaler Inhale 2 puffs into the lungs 2 (two) times daily.   Yes [provider]  diphenhydrAMINE (BENADRYL) 25 MG tablet Take 25 mg by mouth every 8 (eight) hours as needed for itching, allergies or sleep. 10/31/22  Yes [provider]  fluticasone  (FLONASE ) 50 MCG/ACT nasal spray Place 1 spray into both nostrils daily. Patient taking differently: Place 2 sprays into both nostrils daily as needed for allergies. 03/06/20  Yes Hall-Potvin, [place], PA-C  fluticasone  (FLONASE ) 50 MCG/ACT nasal spray Place 1 spray into both nostrils daily. 03/06/20  Yes [provider]  ondansetron (ZOFRAN-ODT) 4 MG disintegrating tablet Take 1 tablet (4 mg total) by mouth every 8 (eight) hours as needed for nausea or vomiting. 04/23/24  Yes Arijana Narayan M, FNP  acetaminophen (TYLENOL) 325 MG tablet Take 650 mg by mouth every 6 (six) hours as needed.    [provider]  albuterol  (PROVENTIL ) (2.5 MG/3ML) 0.083% nebulizer solution Take 2.5 mg by nebulization every 6 (six) hours as needed for wheezing or shortness of breath. 11/17/23   [provider]  azithromycin  (ZITHROMAX ) 250 MG tablet Take 1 tablet (250 mg total) by mouth daily. Take first 2 tablets together, then 1 every day until finished. Patient not taking: Reported on 04/23/2024 03/27/24   Vernestine Gondola, PA-C  diphenhydrAMINE (BENADRYL) 25 MG tablet Take  25 mg by mouth at bedtime as needed for sleep.    [provider]  Docusate Sodium (DSS) 100 MG CAPS Take 100 mg by mouth 2 (two) times daily. 10/24/22   [provider]  ferrous sulfate 324 MG TBEC Take 324 mg by mouth.    [provider]  HYDROcodone-acetaminophen (NORCO/VICODIN) 5-325 MG tablet Take 1 tablet by mouth every 6 (six) hours as needed. Patient not taking: Reported on 04/23/2024 10/31/22   [provider]  predniSONE  (DELTASONE ) 10 MG tablet Take 10 mg by mouth See admin instructions. Patient not taking: Reported on 04/23/2024 03/20/24   [provider]  predniSONE  (DELTASONE ) 20 MG tablet Take 20 mg by mouth as directed. Patient not taking: Reported on 04/23/2024 06/21/20   [provider]  predniSONE  (STERAPRED UNI-PAK 21 TAB) 10 MG (21) TBPK tablet Take 10 mg by mouth as directed. Patient not taking: Reported on 04/23/2024 03/20/24   [provider]    Family History Family History  Problem Relation Age of Onset   Cervical cancer Mother    COPD Father     Social History Social History   Tobacco Use   Smoking status: Former    Types: Cigarettes   Smokeless tobacco: Never  Vaping Use   Vaping status: Never Used  Substance Use Topics   Alcohol use: Not Currently   Drug use: Never     Allergies   Patient has no known allergies.   Review of Systems Review of Systems  Gastrointestinal:  Positive for abdominal pain, diarrhea and vomiting.  Per HPI   Physical Exam Triage Vital Signs ED Triage Vitals [04/23/24 0951]  Encounter Vitals Group     BP 132/88     Systolic BP Percentile      Diastolic BP Percentile      Pulse Rate (!) 102     Resp 18     Temp 98.1 F (36.7 C)     Temp Source Oral     SpO2 95 %     Weight      Height      Head Circumference      Peak Flow      Pain Score 10     Pain Loc      Pain Education      Exclude from Growth Chart    No data found.  Updated Vital  Signs BP 132/88 (BP Location: Left Arm)   Pulse (!) 102   Temp 98.1 F (36.7 C) (Oral)   Resp 18   SpO2 95%   Visual Acuity Right Eye Distance:   Left Eye Distance:   Bilateral Distance:    Right Eye Near:   Left Eye Near:    Bilateral Near:     Physical Exam Vitals and nursing note reviewed.  Constitutional:      Appearance: She is ill-appearing. She is not toxic-appearing.  HENT:     Head: Normocephalic and atraumatic.     Right Ear: Hearing and external ear normal.  Left Ear: Hearing and external ear normal.     Nose: Nose normal.     Mouth/Throat:     Lips: Pink.  Eyes:     General: Lids are normal. Vision grossly intact. Gaze aligned appropriately.     Extraocular Movements: Extraocular movements intact.     Conjunctiva/sclera: Conjunctivae normal.  Pulmonary:     Effort: Pulmonary effort is normal.  Abdominal:     General: Abdomen is flat. Bowel sounds are normal. There is no distension or abdominal bruit. There are no signs of injury.     Palpations: Abdomen is soft.     Tenderness: There is generalized abdominal tenderness (Diffuse tenderness to palpation over the entire abdomen.). There is no right CVA tenderness, left CVA tenderness, guarding or rebound. Negative signs include Murphy's sign, Rovsing's sign and McBurney's sign.     Comments: No peritoneal signs on abdominal exam.  Musculoskeletal:     Cervical back: Neck supple.  Skin:    General: Skin is warm and dry.     Capillary Refill: Capillary refill takes less than 2 seconds.     Findings: No rash.  Neurological:     General: No focal deficit present.     Mental Status: She is alert and oriented to person, place, and time. Mental status is at baseline.     Cranial Nerves: No dysarthria or facial asymmetry.  Psychiatric:        Mood and Affect: Mood normal.        Speech: Speech normal.        Behavior: Behavior normal.        Thought Content: Thought content normal.        Judgment: Judgment  normal.      UC Treatments / Results  Labs (all labs ordered are listed, but only abnormal results are displayed) Labs Reviewed - No data to display  EKG   Radiology No results found.  Procedures Procedures (including critical care time)  Medications Ordered in UC Medications  ondansetron (ZOFRAN-ODT) disintegrating tablet 4 mg (4 mg Oral Given 04/23/24 0959)  ondansetron (ZOFRAN) injection 4 mg (4 mg Intramuscular Given 04/23/24 1020)    Initial Impression / Assessment and Plan / UC Course  I have reviewed the triage vital signs and the nursing notes.  Pertinent labs & imaging results that were available during my care of the patient were reviewed by me and considered in my medical decision making (see chart for details).   1.  Viral gastroenteritis Evaluation suggests viral gastrointestinal illness etiology.  Patient nontoxic appearing with hemodynamically stable vital signs, abdominal exam without peritoneal signs/focal tenderness. Will manage this with antiemetic (Zofran) as needed, OTC medicines as needed for discomfort/pain, increased fluids, and rest. Zofran 4mg  ODT given initially for nausea and vomiting.  This did not improve nausea and vomiting and patient remained actively dry heaving in clinic.  Zofran 4 mg IM given, p.o. challenge completed, patient able to tolerate sips of water prior to discharge from urgent care. Liquid/bland diet initially, then increase diet as tolerated.   Counseled patient on potential for adverse effects with medications prescribed/recommended today, strict ER and return-to-clinic precautions discussed, patient verbalized understanding.    Final Clinical Impressions(s) / UC Diagnoses   Final diagnoses:  Viral gastroenteritis  Nausea and vomiting, unspecified vomiting type     Discharge Instructions      Your evaluation suggests that your symptoms are most likely due to viral stomach illness (gastroenteritis/"stomach bug") which  will improve  on its own with rest and fluids in the next few days.   Take zofran to help with nausea every 8 hours as needed. You may use over the counter medicines for aches and pains such as tylenol as needed.  Start sipping on liquids (broth, water, gatorade, etc). If you are able to keep liquids down without vomiting for 1-2 hours, you may eat bland foods like jello, pudding, applesauce, bananas, rice, and white toast. Once you can tolerate blands, you may return to normal diet.   Pedialyte or gatorolyte may help to prevent/fix dehydration due to vomiting and diarrhea.  Please follow up with your primary care provider for further management. Return if you experience worsening or uncontrolled pain, inability to tolerate fluids by mouth, difficulty breathing, fevers 100.74F or greater, recurrent vomiting, or any other concerning symptoms.     ED Prescriptions     Medication Sig Dispense Auth. Provider   ondansetron (ZOFRAN-ODT) 4 MG disintegrating tablet Take 1 tablet (4 mg total) by mouth every 8 (eight) hours as needed for nausea or vomiting. 20 tablet Starlene Eaton, FNP      PDMP not reviewed this encounter.   Starlene Eaton, [place] 04/23/24 1044

## 2024-05-02 DIAGNOSIS — Z87891 Personal history of nicotine dependence: Secondary | ICD-10-CM | POA: Insufficient documentation

## 2024-08-18 ENCOUNTER — Ambulatory Visit: Admission: EM | Admit: 2024-08-18 | Discharge: 2024-08-18 | Disposition: A | Discharge status: Home / Self Care

## 2024-08-18 DIAGNOSIS — R0600 Dyspnea, unspecified: Secondary | ICD-10-CM | POA: Insufficient documentation

## 2024-08-18 DIAGNOSIS — J45901 Unspecified asthma with (acute) exacerbation: Secondary | ICD-10-CM | POA: Diagnosis not present

## 2024-08-18 DIAGNOSIS — J069 Acute upper respiratory infection, unspecified: Secondary | ICD-10-CM | POA: Insufficient documentation

## 2024-08-18 DIAGNOSIS — F5101 Primary insomnia: Secondary | ICD-10-CM | POA: Insufficient documentation

## 2024-08-18 DIAGNOSIS — G8929 Other chronic pain: Secondary | ICD-10-CM | POA: Insufficient documentation

## 2024-08-18 DIAGNOSIS — J029 Acute pharyngitis, unspecified: Secondary | ICD-10-CM | POA: Insufficient documentation

## 2024-08-18 MED ORDER — PREDNISONE 10 MG (21) PO TBPK: ORAL_TABLET | Freq: Every day | ORAL | 0 refills | Status: DC

## 2024-08-18 NOTE — ED Triage Notes (Signed)
 My asthma is flaring up, I am wheezing/coughing, nothing makes me feel better except for antibiotics and treatment. This episode started a few days ago. No fever. No runny nose. I do have a PCP.

## 2024-08-18 NOTE — ED Provider Notes (Signed)
 [name]    CSN: [id number] Arrival date & time: 08/18/24  1602      History   Chief Complaint Chief Complaint  Patient presents with   Asthma Exacerbation    HPI Debbie Contreras is a 43 y.o. female.  Patient with past history significant for asthma presents to the urgent care with concerns of cough and asthma flare.  Reports that she has been using at home inhalers and nebulizers with minimal improvement symptoms.  Denies any productive cough.  No reported fever, chills or bodyaches.  States that she typically has flareups like this multiple times per year and only gets significant proving after a steroid burst.  HPI  Past Medical History:  Diagnosis Date   Asthma    Iron deficiency     Patient Active Problem List   Diagnosis Date Noted   Dyspnea 08/18/2024   Other chronic pain 08/18/2024   Primary insomnia 08/18/2024   Sore throat 08/18/2024   Upper respiratory tract infection 08/18/2024   Former smoker 05/02/2024   Morbid obesity with BMI of 40.0-44.9, adult (HCC) 05/02/2024   History of robot-assisted laparoscopic hysterectomy 11/01/2022   Chronic pansinusitis 03/30/2020   Nasal polyps 03/15/2020   Anosmia 03/15/2020   Cough 01/30/2020   Encounter for screening for COVID-19 01/30/2020   Unspecified asthma with (acute) exacerbation 09/04/2019   Allergic rhinitis, unspecified 09/04/2019   Morbid (severe) obesity due to excess calories (HCC) 09/04/2019   Other disturbances of smell and taste 09/04/2019    Past Surgical History:  Procedure Laterality Date   CESAREAN SECTION     LAPAROSCOPIC HYSTERECTOMY  10/2022   TONSILLECTOMY     age 64    OB History     Gravida  2   Para      Term      Preterm      AB      Living         SAB      IAB      Ectopic      Multiple      Live Births               Home Medications    Prior to Admission medications   Medication Sig Start Date End Date Taking? Authorizing Provider   albuterol  (PROVENTIL ) (2.5 MG/3ML) 0.083% nebulizer solution Inhale 2.5 mg into the lungs every 6 (six) hours as needed for wheezing or shortness of breath. Last used: 1400. 11/17/23  Yes [provider]  budesonide (PULMICORT) 0.5 MG/2ML nebulizer solution Add 2 mL (1 vial) of budesonide into 240 mL saline irrigation bottle and irrigate both nostrils morning and night (one bottle total per day). 05/02/24  Yes [provider]  budesonide-formoterol (SYMBICORT) 160-4.5 MCG/ACT inhaler Inhale 2 puffs into the lungs 2 (two) times daily.   Yes [provider]  montelukast  (SINGULAIR ) 10 MG tablet Take 10 mg by mouth at bedtime. 08/05/24 08/05/25 Yes [provider]  predniSONE  (STERAPRED UNI-PAK 21 TAB) 10 MG (21) TBPK tablet Take by mouth daily. Take as directed. 08/18/24  Yes Annaleia Pence A, PA-C  acetaminophen (TYLENOL) 325 MG tablet Take 650 mg by mouth every 6 (six) hours as needed.    [provider]  albuterol  (PROAIR  HFA) 108 (90 Base) MCG/ACT inhaler Inhale one puff into lungs PRN Inhalation every 4 hrs; Duration: 30 days    [provider]  albuterol  (PROVENTIL ) (2.5 MG/3ML) 0.083% nebulizer solution Take 2.5 mg by nebulization every  6 (six) hours as needed for wheezing or shortness of breath. 11/17/23   [provider]  albuterol  (VENTOLIN  HFA) 108 (90 Base) MCG/ACT inhaler Inhale 2 puffs into the lungs every 6 (six) hours as needed for wheezing or shortness of breath.     [provider]  azithromycin  (ZITHROMAX ) 250 MG tablet Take 1 tablet (250 mg total) by mouth daily. Take first 2 tablets together, then 1 every day until finished. Patient not taking: Reported on 04/23/2024 03/27/24   Billy Asberry FALCON, PA-C  diphenhydrAMINE (BENADRYL) 25 MG tablet Take 25 mg by mouth at bedtime as needed for sleep.    [provider]  diphenhydrAMINE (BENADRYL) 25 MG tablet Take 25 mg by mouth every 8 (eight) hours as needed for  itching, allergies or sleep. 10/31/22   [provider]  Docusate Sodium (DSS) 100 MG CAPS Take 100 mg by mouth 2 (two) times daily. 10/24/22   [provider]  ferrous sulfate 324 MG TBEC Take 324 mg by mouth.    [provider]  fluticasone  (FLONASE ) 50 MCG/ACT nasal spray Place 1 spray into both nostrils daily. Patient taking differently: Place 2 sprays into both nostrils daily as needed for allergies. 03/06/20   Hall-Potvin, [place], PA-C  fluticasone  (FLONASE ) 50 MCG/ACT nasal spray Place 1 spray into both nostrils daily. 03/06/20   [provider]  HYDROcodone-acetaminophen (NORCO/VICODIN) 5-325 MG tablet Take 1 tablet by mouth every 6 (six) hours as needed. Patient not taking: Reported on 04/23/2024 10/31/22   [provider]  iron polysaccharides (NIFEREX) 150 MG capsule 1 capsule.    [provider]  ondansetron  (ZOFRAN -ODT) 4 MG disintegrating tablet Take 1 tablet (4 mg total) by mouth every 8 (eight) hours as needed for nausea or vomiting. 04/23/24   Enedelia Dorna HERO, FNP    Family History Family History  Problem Relation Age of Onset   Cervical cancer Mother    COPD Father     Social History Social History   Tobacco Use   Smoking status: Former    Types: Cigarettes   Smokeless tobacco: Never  Vaping Use   Vaping status: Never Used  Substance Use Topics   Alcohol use: Not Currently   Drug use: Never     Allergies   Dust mite extract   Review of Systems Review of Systems  Respiratory:  Positive for cough.   All other systems reviewed and are negative.    Physical Exam Triage Vital Signs ED Triage Vitals  Encounter Vitals Group     BP --      Girls Systolic BP Percentile --      Girls Diastolic BP Percentile --      Boys Systolic BP Percentile --      Boys Diastolic BP Percentile --      Pulse Rate 08/18/24 1626 80     Resp 08/18/24 1626 (!) 22     Temp 08/18/24 1626 97.7 F (36.5 C)     Temp  Source 08/18/24 1626 Oral     SpO2 08/18/24 1626 97 %     Weight 08/18/24 1622 199 lb 15.3 oz (90.7 kg)     Height 08/18/24 1622 5' 1 (1.549 m)     Head Circumference --      Peak Flow --      Pain Score 08/18/24 1620 0     Pain Loc --      Pain Education --      Exclude from [employer]  Chart --    No data found.  Updated Vital Signs Pulse 80   Temp 97.7 F (36.5 C) (Oral)   Resp (!) 22   Ht 5' 1 (1.549 m)   Wt 199 lb 15.3 oz (90.7 kg)   SpO2 97%   BMI 37.78 kg/m   Visual Acuity Right Eye Distance:   Left Eye Distance:   Bilateral Distance:    Right Eye Near:   Left Eye Near:    Bilateral Near:     Physical Exam Vitals and nursing note reviewed.  Constitutional:      General: She is not in acute distress.    Appearance: She is well-developed.  HENT:     Head: Normocephalic and atraumatic.  Eyes:     Conjunctiva/sclera: Conjunctivae normal.  Cardiovascular:     Rate and Rhythm: Normal rate and regular rhythm.     Heart sounds: No murmur heard. Pulmonary:     Effort: Pulmonary effort is normal. No respiratory distress.     Breath sounds: Wheezing present.     Comments: Faint wheezing in all lung fields. Abdominal:     Palpations: Abdomen is soft.     Tenderness: There is no abdominal tenderness.  Musculoskeletal:        General: No swelling.     Cervical back: Neck supple.  Skin:    General: Skin is warm and dry.     Capillary Refill: Capillary refill takes less than 2 seconds.  Neurological:     Mental Status: She is alert.  Psychiatric:        Mood and Affect: Mood normal.      UC Treatments / Results  Labs (all labs ordered are listed, but only abnormal results are displayed) Labs Reviewed - No data to display  EKG   Radiology No results found.  Procedures Procedures (including critical care time)  Medications Ordered in UC Medications - No data to display  Initial Impression / Assessment and Plan / UC Course  I have reviewed the  triage vital signs and the nursing notes.  Pertinent labs & imaging results that were available during my care of the patient were reviewed by me and considered in my medical decision making (see chart for details).   This patient presents to the UC for concern of exacerbation.  Differential diagnosis includes mild asthma exacerbation, bronchitis, pneumonia   Problem List / UC Course:  Patient reportedly has had issues for the last several days with feelings of increased difficulty breathing primarily with exertion.  No reported congestion, sore throat, fever, chills or bodyaches.  Denies any sick contacts.   Patient has minimal wheezing in all lung fields.  No obvious rales or rhonchi. I discussed treating with a breathing treatment here at the urgent care but patient reportedly is requesting to have a prescription for steroids sent to her pharmacy.  States that she has a nebulizer at home that she can use if needed.  Given lack of infectious symptoms such as fever, tachycardia, or hypoxia, less concern for pneumonia.  Will start patient on a course of prednisone  for the next 6 days.  Advised patient to use his medication as prescribed and to follow-up closely with her primary care provider or pulmonologist.  Otherwise stable for outpatient follow-up and discharged home.   Social Determinants of Health:  None  Final Clinical Impressions(s) / UC Diagnoses   Final diagnoses:  Mild asthma exacerbation     Discharge Instructions  You were seen today at the urgent care for concerns of an asthma flare up. Your exam did show wheezing broadly but no crackling. I suspect this is likely a continued flare up of your asthma and have started you on a prednisone  burst. Take this as prescribed. Please follow up with your primary care provider/pulmonologist for further evaluation.     ED Prescriptions     Medication Sig Dispense Auth. Provider   predniSONE  (STERAPRED UNI-PAK 21 TAB) 10 MG  (21) TBPK tablet Take by mouth daily. Take as directed. 21 tablet Kamyah Wilhelmsen A, PA-C      PDMP not reviewed this encounter.   Jalesha Plotz A, PA-C 08/18/24 1644

## 2024-08-18 NOTE — Discharge Instructions (Signed)
 You were seen today at the urgent care for concerns of an asthma flare up. Your exam did show wheezing broadly but no crackling. I suspect this is likely a continued flare up of your asthma and have started you on a prednisone  burst. Take this as prescribed. Please follow up with your primary care provider/pulmonologist for further evaluation.

## 2024-09-03 ENCOUNTER — Emergency Department (HOSPITAL_COMMUNITY)
Admission: EM | Admit: 2024-09-03 | Discharge: 2024-09-03 | Discharge status: Against Medical Advice | Attending: Emergency Medicine | Admitting: Emergency Medicine

## 2024-09-03 ENCOUNTER — Emergency Department (HOSPITAL_COMMUNITY)

## 2024-09-03 ENCOUNTER — Other Ambulatory Visit: Payer: Self-pay

## 2024-09-03 DIAGNOSIS — J45909 Unspecified asthma, uncomplicated: Secondary | ICD-10-CM | POA: Insufficient documentation

## 2024-09-03 DIAGNOSIS — Z5321 Procedure and treatment not carried out due to patient leaving prior to being seen by health care provider: Secondary | ICD-10-CM | POA: Insufficient documentation

## 2024-09-03 DIAGNOSIS — R0602 Shortness of breath: Secondary | ICD-10-CM | POA: Insufficient documentation

## 2024-09-03 LAB — I-STAT CHEM 8, ED
BUN: 14 mg/dL (ref 6–20)
Calcium, Ion: 1.1 mmol/L — ABNORMAL LOW (ref 1.15–1.40)
Chloride: 104 mmol/L (ref 98–111)
Creatinine, Ser: 1 mg/dL (ref 0.44–1.00)
Glucose, Bld: 91 mg/dL (ref 70–99)
HCT: 44 % (ref 36.0–46.0)
Hemoglobin: 15 g/dL (ref 12.0–15.0)
Potassium: 4.1 mmol/L (ref 3.5–5.1)
Sodium: 137 mmol/L (ref 135–145)
TCO2: 22 mmol/L (ref 22–32)

## 2024-09-03 LAB — COMPREHENSIVE METABOLIC PANEL WITH GFR
ALT: 21 U/L (ref 0–44)
AST: 19 U/L (ref 15–41)
Albumin: 3.6 g/dL (ref 3.5–5.0)
Alkaline Phosphatase: 74 U/L (ref 38–126)
Anion gap: 12 (ref 5–15)
BUN: 13 mg/dL (ref 6–20)
CO2: 23 mmol/L (ref 22–32)
Calcium: 8.7 mg/dL — ABNORMAL LOW (ref 8.9–10.3)
Chloride: 101 mmol/L (ref 98–111)
Creatinine, Ser: 0.94 mg/dL (ref 0.44–1.00)
GFR, Estimated: >60 mL/min (ref >60)
Glucose, Bld: 91 mg/dL (ref 70–99)
Potassium: 4.1 mmol/L (ref 3.5–5.1)
Sodium: 136 mmol/L (ref 135–145)
Total Bilirubin: 0.7 mg/dL (ref 0.0–1.2)
Total Protein: 6.9 g/dL (ref 6.5–8.1)

## 2024-09-03 LAB — CBC WITH DIFFERENTIAL/PLATELET
Abs Immature Granulocytes: 0.02 K/uL (ref 0.00–0.07)
Basophils Absolute: 0 K/uL (ref 0.0–0.1)
Basophils Relative: 1 %
Eosinophils Absolute: 0.9 K/uL — ABNORMAL HIGH (ref 0.0–0.5)
Eosinophils Relative: 11 %
HCT: 44.7 % (ref 36.0–46.0)
Hemoglobin: 14.5 g/dL (ref 12.0–15.0)
Immature Granulocytes: 0 %
Lymphocytes Relative: 10 %
Lymphs Abs: 0.8 K/uL (ref 0.7–4.0)
MCH: 26.8 pg (ref 26.0–34.0)
MCHC: 32.4 g/dL (ref 30.0–36.0)
MCV: 82.5 fL (ref 80.0–100.0)
Monocytes Absolute: 0.7 K/uL (ref 0.1–1.0)
Monocytes Relative: 8 %
Neutro Abs: 5.9 K/uL (ref 1.7–7.7)
Neutrophils Relative %: 70 %
Platelets: 281 K/uL (ref 150–400)
RBC: 5.42 MIL/uL — ABNORMAL HIGH (ref 3.87–5.11)
RDW: 14.6 % (ref 11.5–15.5)
WBC: 8.3 K/uL (ref 4.0–10.5)
nRBC: 0 % (ref 0.0–0.2)

## 2024-09-03 LAB — HCG, SERUM, QUALITATIVE: Preg, Serum: NEGATIVE

## 2024-09-03 NOTE — ED Notes (Signed)
 Pt informed staff at front desk that she was going to her PCP.

## 2024-09-03 NOTE — ED Triage Notes (Signed)
 Pt bib POV c/o asthma that has been going on for the last couple of weeks. Pt state she has been using her inhaler without relief. Pt denies being around anything that triggers asthma, smoking, or anyone sick.

## 2024-09-13 ENCOUNTER — Encounter: Payer: Self-pay | Admitting: *Deleted

## 2024-09-13 ENCOUNTER — Ambulatory Visit
Admission: EM | Admit: 2024-09-13 | Discharge: 2024-09-13 | Disposition: A | Discharge status: Home / Self Care | Attending: Emergency Medicine | Admitting: Emergency Medicine

## 2024-09-13 DIAGNOSIS — Z77098 Contact with and (suspected) exposure to other hazardous, chiefly nonmedicinal, chemicals: Secondary | ICD-10-CM | POA: Diagnosis not present

## 2024-09-13 DIAGNOSIS — H5789 Other specified disorders of eye and adnexa: Secondary | ICD-10-CM | POA: Diagnosis not present

## 2024-09-13 MED ORDER — OFLOXACIN 0.3 % OP SOLN: 1.0000 [drp] | Freq: Three times a day (TID) | OPHTHALMIC | 0 refills | Status: AC

## 2024-09-13 NOTE — Discharge Instructions (Addendum)
 Your eye exam does not show any ulceration or abrasion of your eye!  Use the antibiotic eye drops for comfort and to prevent infection 3 times daily, in the right eye only, for 5 days  You can use the eye wash at home, but only use if before the eye drops so that you don't flush the antibiotic out  Please follow with your eye specialist if needed

## 2024-09-13 NOTE — ED Triage Notes (Signed)
 Pt reports she splashed Chlorox in her eye right eye this morning. She flushed it with water pta. Right eye is red and slightly swollen.

## 2024-09-13 NOTE — ED Provider Notes (Signed)
 [name]    CSN: [id number] Arrival date & time: 09/13/24  1009      History   Chief Complaint Chief Complaint  Patient presents with   Chemical Exposure    HPI Debbie Contreras is a 43 y.o. female.  Here with concern for right eye irritation and redness.  This started after she splashed Clorox water into the eye while she was cleaning. Works at [employer]. Finished her shift, and then went home to flush eye. Reports rinsed out for 20 minutes.  Rating pain 5/10 currently. Slightly watery and mild blurry.   Does not wear contacts  Has eye specialist   Past Medical History:  Diagnosis Date   Asthma    Iron deficiency     Patient Active Problem List   Diagnosis Date Noted   Dyspnea 08/18/2024   Other chronic pain 08/18/2024   Primary insomnia 08/18/2024   Sore throat 08/18/2024   Upper respiratory tract infection 08/18/2024   Former smoker 05/02/2024   Morbid obesity with BMI of 40.0-44.9, adult (HCC) 05/02/2024   History of robot-assisted laparoscopic hysterectomy 11/01/2022   Chronic pansinusitis 03/30/2020   Nasal polyps 03/15/2020   Anosmia 03/15/2020   Cough 01/30/2020   Encounter for screening for COVID-19 01/30/2020   Unspecified asthma with (acute) exacerbation 09/04/2019   Allergic rhinitis, unspecified 09/04/2019   Morbid (severe) obesity due to excess calories (HCC) 09/04/2019   Other disturbances of smell and taste 09/04/2019    Past Surgical History:  Procedure Laterality Date   CESAREAN SECTION     LAPAROSCOPIC HYSTERECTOMY  10/2022   TONSILLECTOMY     age 12    OB History     Gravida  2   Para      Term      Preterm      AB      Living         SAB      IAB      Ectopic      Multiple      Live Births               Home Medications    Prior to Admission medications   Medication Sig Start Date End Date Taking? Authorizing Provider  albuterol  (PROAIR  HFA) 108 (90 Base) MCG/ACT inhaler Inhale  one puff into lungs PRN Inhalation every 4 hrs; Duration: 30 days   Yes [provider]  albuterol  (PROVENTIL ) (2.5 MG/3ML) 0.083% nebulizer solution Take 2.5 mg by nebulization every 6 (six) hours as needed for wheezing or shortness of breath. 11/17/23  Yes [provider]  albuterol  (PROVENTIL ) (2.5 MG/3ML) 0.083% nebulizer solution Inhale 2.5 mg into the lungs every 6 (six) hours as needed for wheezing or shortness of breath. Last used: 1400. 11/17/23  Yes [provider]  albuterol  (VENTOLIN  HFA) 108 (90 Base) MCG/ACT inhaler Inhale 2 puffs into the lungs every 6 (six) hours as needed for wheezing or shortness of breath.    Yes [provider]  budesonide-formoterol (SYMBICORT) 160-4.5 MCG/ACT inhaler Inhale 2 puffs into the lungs 2 (two) times daily.   Yes [provider]  montelukast  (SINGULAIR ) 10 MG tablet Take 10 mg by mouth at bedtime. 08/05/24 08/05/25 Yes [provider]  ofloxacin  (OCUFLOX ) 0.3 % ophthalmic solution Place 1 drop into the right eye in the morning, at noon, and at bedtime for 5 days. 09/13/24 09/18/24 Yes Ceanna Wareing, [place], PA-C  acetaminophen (TYLENOL) 325 MG tablet Take 650 mg by  mouth every 6 (six) hours as needed.    [provider]  azithromycin  (ZITHROMAX ) 250 MG tablet Take 1 tablet (250 mg total) by mouth daily. Take first 2 tablets together, then 1 every day until finished. Patient not taking: Reported on 04/23/2024 03/27/24   Billy Asberry FALCON, PA-C  budesonide (PULMICORT) 0.5 MG/2ML nebulizer solution Add 2 mL (1 vial) of budesonide into 240 mL saline irrigation bottle and irrigate both nostrils morning and night (one bottle total per day). 05/02/24   [provider]  diphenhydrAMINE (BENADRYL) 25 MG tablet Take 25 mg by mouth at bedtime as needed for sleep.    [provider]  diphenhydrAMINE (BENADRYL) 25 MG tablet Take 25 mg by mouth every 8 (eight) hours as needed for itching, allergies or  sleep. 10/31/22   [provider]  Docusate Sodium (DSS) 100 MG CAPS Take 100 mg by mouth 2 (two) times daily. 10/24/22   [provider]  ferrous sulfate 324 MG TBEC Take 324 mg by mouth.    [provider]  fluticasone  (FLONASE ) 50 MCG/ACT nasal spray Place 1 spray into both nostrils daily. Patient taking differently: Place 2 sprays into both nostrils daily as needed for allergies. 03/06/20   Hall-Potvin, [place], PA-C  fluticasone  (FLONASE ) 50 MCG/ACT nasal spray Place 1 spray into both nostrils daily. Patient not taking: Reported on 09/13/2024 03/06/20   [provider]  HYDROcodone-acetaminophen (NORCO/VICODIN) 5-325 MG tablet Take 1 tablet by mouth every 6 (six) hours as needed. Patient not taking: Reported on 04/23/2024 10/31/22   [provider]  iron polysaccharides (NIFEREX) 150 MG capsule 1 capsule.    [provider]  ondansetron  (ZOFRAN -ODT) 4 MG disintegrating tablet Take 1 tablet (4 mg total) by mouth every 8 (eight) hours as needed for nausea or vomiting. 04/23/24   Enedelia Dorna HERO, FNP  predniSONE  (STERAPRED UNI-PAK 21 TAB) 10 MG (21) TBPK tablet Take by mouth daily. Take as directed. Patient not taking: Reported on 09/13/2024 08/18/24   Zelaya, Oscar A, PA-C    Family History Family History  Problem Relation Age of Onset   Cervical cancer Mother    COPD Father     Social History Social History   Tobacco Use   Smoking status: Former    Types: Cigarettes   Smokeless tobacco: Never  Vaping Use   Vaping status: Never Used  Substance Use Topics   Alcohol use: Not Currently   Drug use: Never     Allergies   Dust mite extract   Review of Systems Review of Systems  As per HPI  Physical Exam Triage Vital Signs ED Triage Vitals [09/13/24 1020]  Encounter Vitals Group     BP 123/85     Girls Systolic BP Percentile      Girls Diastolic BP Percentile      Boys Systolic BP Percentile      Boys Diastolic BP  Percentile      Pulse Rate 94     Resp 20     Temp 97.8 F (36.6 C)     Temp Source Oral     SpO2 94 %     Weight      Height      Head Circumference      Peak Flow      Pain Score      Pain Loc      Pain Education      Exclude from Growth Chart    No data found.  Updated Vital Signs BP 123/85 (BP Location: Left Arm)   Pulse 94   Temp 97.8 F (36.6 C) (Oral)   Resp 20   SpO2 94%   Visual Acuity Right Eye Distance: 20/40 Left Eye Distance: 20/25 Bilateral Distance: 20/20 (Uncorrected)  Right Eye Near:   Left Eye Near:    Bilateral Near:     Physical Exam Vitals and nursing note reviewed.  Constitutional:      General: She is not in acute distress.    Appearance: She is not ill-appearing.  Eyes:     General: Lids are normal. Vision grossly intact. Gaze aligned appropriately.        Right eye: No foreign body or discharge.     Extraocular Movements: Extraocular movements intact.     Right eye: Normal extraocular motion.     Conjunctiva/sclera:     Right eye: Right conjunctiva is injected. No chemosis, exudate or hemorrhage.    Pupils: Pupils are equal, round, and reactive to light.     Right eye: No corneal abrasion or fluorescein uptake.     Comments: Right conjunctiva is injected.  EOM intact without pain.  No periorbital erythema or signs of skin trauma.  Fluorescein exam without uptake.  Cardiovascular:     Rate and Rhythm: Normal rate and regular rhythm.  Pulmonary:     Effort: Pulmonary effort is normal.  Musculoskeletal:     Cervical back: Normal range of motion.  Skin:    General: Skin is warm and dry.  Neurological:     Mental Status: She is alert and oriented to person, place, and time.     UC Treatments / Results  Labs (all labs ordered are listed, but only abnormal results are displayed) Labs Reviewed - No data to display  EKG   Radiology No results found.  Procedures Procedures (including critical care time)  Medications Ordered  in UC Medications - No data to display  Initial Impression / Assessment and Plan / UC Course  I have reviewed the triage vital signs and the nursing notes.  Pertinent labs & imaging results that were available during my care of the patient were reviewed by me and considered in my medical decision making (see chart for details).  Vision grossly intact, 20/40 in the irritated eye. Right eye was irrigated a second time by RN in clinic. Symptoms resolved after tetracaine applied.  No corneal abrasion or ulceration with fluorescein exam.  Reassurance provided to patient.  Likely irritation post chemical exposure.  I recommend antibiotic eyedrops, ofloxacin  3 times daily for 5 days, to prevent infection and for comfort.  She can also continue irrigation at home.  Advise going up with her eye specialist if needed.  Signs and symptoms to monitor for are discussed, return and ED precautions.  Final Clinical Impressions(s) / UC Diagnoses   Final diagnoses:  Chemical exposure of eye  Irritation of right eye  Accidental exposure to bleach     Discharge Instructions      Your eye exam does not show any ulceration or abrasion of your eye!  Use the antibiotic eye drops for comfort and to prevent infection 3 times daily, in the right eye only, for 5 days  You can use the eye wash at home, but only use if before the eye drops so that you don't flush the antibiotic out  Please follow with your eye specialist if needed     ED Prescriptions     Medication Sig Dispense Auth.  Provider   ofloxacin  (OCUFLOX ) 0.3 % ophthalmic solution Place 1 drop into the right eye in the morning, at noon, and at bedtime for 5 days. 5 mL Egon Dittus, [place], PA-C      PDMP not reviewed this encounter.   Jeryl Asberry, PA-C 09/13/24 1153

## 2024-11-29 ENCOUNTER — Ambulatory Visit: Admission: EM | Admit: 2024-11-29 | Discharge: 2024-11-29 | Disposition: A | Discharge status: Home / Self Care

## 2024-11-29 ENCOUNTER — Ambulatory Visit

## 2024-11-29 DIAGNOSIS — J4521 Mild intermittent asthma with (acute) exacerbation: Secondary | ICD-10-CM

## 2024-11-29 DIAGNOSIS — R062 Wheezing: Secondary | ICD-10-CM

## 2024-11-29 DIAGNOSIS — J4541 Moderate persistent asthma with (acute) exacerbation: Secondary | ICD-10-CM | POA: Insufficient documentation

## 2024-11-29 DIAGNOSIS — R0602 Shortness of breath: Secondary | ICD-10-CM

## 2024-11-29 DIAGNOSIS — J4551 Severe persistent asthma with (acute) exacerbation: Secondary | ICD-10-CM | POA: Insufficient documentation

## 2024-11-29 DIAGNOSIS — J209 Acute bronchitis, unspecified: Secondary | ICD-10-CM

## 2024-11-29 DIAGNOSIS — J4540 Moderate persistent asthma, uncomplicated: Secondary | ICD-10-CM | POA: Insufficient documentation

## 2024-11-29 DIAGNOSIS — R051 Acute cough: Secondary | ICD-10-CM | POA: Diagnosis not present

## 2024-11-29 DIAGNOSIS — G8929 Other chronic pain: Secondary | ICD-10-CM | POA: Insufficient documentation

## 2024-11-29 DIAGNOSIS — J069 Acute upper respiratory infection, unspecified: Secondary | ICD-10-CM | POA: Insufficient documentation

## 2024-11-29 MED ORDER — PREDNISONE 50 MG PO TABS: 50.0000 mg | ORAL_TABLET | Freq: Every day | ORAL | 0 refills | Status: AC

## 2024-11-29 MED ORDER — IPRATROPIUM-ALBUTEROL 0.5-2.5 (3) MG/3ML IN SOLN
3.0000 mL | Freq: Once | RESPIRATORY_TRACT | Status: AC
  Administered 2024-11-29: 3 mL via RESPIRATORY_TRACT

## 2024-11-29 MED ORDER — AMOXICILLIN-POT CLAVULANATE 875-125 MG PO TABS: 1.0000 | ORAL_TABLET | Freq: Two times a day (BID) | ORAL | 0 refills | Status: AC

## 2024-11-29 MED ORDER — PROMETHAZINE-DM 6.25-15 MG/5ML PO SYRP: 5.0000 mL | ORAL_SOLUTION | Freq: Three times a day (TID) | ORAL | 0 refills | Status: AC | PRN

## 2024-11-29 MED ORDER — METHYLPREDNISOLONE SODIUM SUCC 125 MG IJ SOLR
80.0000 mg | Freq: Once | INTRAMUSCULAR | Status: AC
  Administered 2024-11-29: 80 mg via INTRAMUSCULAR

## 2024-11-29 NOTE — Discharge Instructions (Addendum)
 Chest x-ray done today.  Final evaluation by the radiologist does not show any acute process however physical exam findings and symptoms are most consistent with an acute bronchitis along with asthma exacerbation.  Due to the severity and duration we will treat with antibiotics by mouth.  We have treated with the following: Medrol  injection given today. This is a steroid to help with swelling and allergic reaction.  DuoNeb breathing treatment done today in clinic.  With improvement in lung sounds. Augmentin  875 mg twice daily for 7 days.  This is an antibiotic.  Take this with food. Start 11/30/24 Prednisone  50 mg once daily for 4 days. Take this in the morning.  This is a steroid to help with inflammation and pain. Promethazine  DM 5 mL every 8 hours as needed for cough.  Use caution as this medication can cause drowsiness.  Make sure to stay hydrated by drinking plenty of water. Return to urgent care or PCP if symptoms worsen or fail to resolve.

## 2024-11-29 NOTE — ED Triage Notes (Signed)
 Patient reports having an asthma flare-up (cough), this started about a week ago but now having a lot of wheezing and trouble breathing. No runny nose. No fever.

## 2024-11-29 NOTE — ED Provider Notes (Signed)
 [name]    CSN: [id number] Arrival date & time: 11/29/24  1059      History   Chief Complaint Chief Complaint  Patient presents with   Asthma Problem    HPI Debbie Contreras is a 43 y.o. female.   43 year old female who presents urgent care with complaints of shortness of breath, cough, wheezing, congestion, sore throat and generally feeling unwell.  She reports that the cough has gotten much more severe.  She has had 2 utilize her breathing treatments more frequently.  She reports that the cough is productive at times.  She denies any sick contacts that she knows of.     Past Medical History:  Diagnosis Date   Asthma    Iron deficiency     Patient Active Problem List   Diagnosis Date Noted   Chronic pain 11/29/2024   Moderate persistent asthma with exacerbation 11/29/2024   Severe persistent asthma with exacerbation (HCC) 11/29/2024   Upper respiratory infection 11/29/2024   Moderate persistent asthma without complication 11/29/2024   Dyspnea 08/18/2024   Other chronic pain 08/18/2024   Primary insomnia 08/18/2024   Sore throat 08/18/2024   Upper respiratory tract infection 08/18/2024   Former smoker 05/02/2024   Morbid obesity with BMI of 40.0-44.9, adult (HCC) 05/02/2024   History of robot-assisted laparoscopic hysterectomy 11/01/2022   Chronic pansinusitis 03/30/2020   Nasal polyps 03/15/2020   Anosmia 03/15/2020   Cough 01/30/2020   Encounter for screening for COVID-19 01/30/2020   Suspected severe acute respiratory syndrome coronavirus 2 (SARS-CoV-2) infection 01/30/2020   Exacerbation of asthma 01/30/2020   Unspecified asthma with (acute) exacerbation 09/04/2019   Allergic rhinitis, unspecified 09/04/2019   Morbid (severe) obesity due to excess calories (HCC) 09/04/2019   Other disturbances of smell and taste 09/04/2019   Allergic rhinitis 09/04/2019   Morbid obesity (HCC) 09/04/2019   Asthma 09/04/2019    Past Surgical History:   Procedure Laterality Date   CESAREAN SECTION     LAPAROSCOPIC HYSTERECTOMY  10/2022   TONSILLECTOMY     age 20    OB History     Gravida  2   Para      Term      Preterm      AB      Living         SAB      IAB      Ectopic      Multiple      Live Births               Home Medications    Prior to Admission medications   Medication Sig Start Date End Date Taking? Authorizing Provider  budesonide-formoterol (SYMBICORT) 160-4.5 MCG/ACT inhaler Inhale 2 puffs into the lungs 2 (two) times daily.   Yes [provider]  Dupilumab 300 MG/2ML SOAJ Inject 300 mg into the skin every 14 (fourteen) days. 09/24/24 03/23/25 Yes [provider]  guaiFENesin (MUCINEX PO) Take by mouth.   Yes [provider]  acetaminophen (TYLENOL) 325 MG tablet Take 650 mg by mouth every 6 (six) hours as needed.    [provider]  albuterol  (PROAIR  HFA) 108 (90 Base) MCG/ACT inhaler Inhale one puff into lungs PRN Inhalation every 4 hrs; Duration: 30 days    [provider]  albuterol  (PROVENTIL ) (2.5 MG/3ML) 0.083% nebulizer solution Take 2.5 mg by nebulization every 6 (six) hours as needed for wheezing or shortness of breath. 11/17/23   [provider]  albuterol  (PROVENTIL ) (2.5 MG/3ML) 0.083% nebulizer solution Inhale 2.5 mg into the lungs every 6 (six) hours as needed for wheezing or shortness of breath. Last used: 1400. 11/17/23   [provider]  albuterol  (VENTOLIN  HFA) 108 (90 Base) MCG/ACT inhaler Inhale 2 puffs into the lungs every 6 (six) hours as needed for wheezing or shortness of breath.     [provider]  amoxicillin -clavulanate (AUGMENTIN ) 875-125 MG tablet Take 1 tablet by mouth every 12 (twelve) hours. 11/29/24  Yes Arvilla Salada A, PA-C  azithromycin  (ZITHROMAX ) 250 MG tablet Take 1 tablet (250 mg total) by mouth daily. Take first 2 tablets together, then 1 every day until finished. Patient not taking:  Reported on 04/23/2024 03/27/24   Billy Asberry FALCON, PA-C  budesonide (PULMICORT) 0.5 MG/2ML nebulizer solution Add 2 mL (1 vial) of budesonide into 240 mL saline irrigation bottle and irrigate both nostrils morning and night (one bottle total per day). 05/02/24   [provider]  diphenhydrAMINE (BENADRYL) 25 MG tablet Take 25 mg by mouth at bedtime as needed for sleep.    [provider]  diphenhydrAMINE (BENADRYL) 25 MG tablet Take 25 mg by mouth every 8 (eight) hours as needed for itching, allergies or sleep. 10/31/22   [provider]  Docusate Sodium (DSS) 100 MG CAPS Take 100 mg by mouth 2 (two) times daily. 10/24/22   [provider]  ferrous sulfate 324 MG TBEC Take 324 mg by mouth.    [provider]  fluticasone  (FLONASE ) 50 MCG/ACT nasal spray Place 1 spray into both nostrils daily. Patient taking differently: Place 2 sprays into both nostrils daily as needed for allergies. 03/06/20   Hall-Potvin, [place], PA-C  fluticasone  (FLONASE ) 50 MCG/ACT nasal spray Place 1 spray into both nostrils daily. Patient not taking: Reported on 09/13/2024 03/06/20   [provider]  HYDROcodone-acetaminophen (NORCO/VICODIN) 5-325 MG tablet Take 1 tablet by mouth every 6 (six) hours as needed. Patient not taking: Reported on 04/23/2024 10/31/22   [provider]  iron polysaccharides (NIFEREX) 150 MG capsule 1 capsule.    [provider]  montelukast  (SINGULAIR ) 10 MG tablet Take 10 mg by mouth at bedtime. 08/05/24 08/05/25  [provider]  ondansetron  (ZOFRAN -ODT) 4 MG disintegrating tablet Take 1 tablet (4 mg total) by mouth every 8 (eight) hours as needed for nausea or vomiting. 04/23/24   Enedelia Dorna HERO, FNP  predniSONE  (DELTASONE ) 50 MG tablet Take 1 tablet (50 mg total) by mouth daily with breakfast. 11/29/24  Yes Kattleya Kuhnert A, PA-C  promethazine -dextromethorphan (PROMETHAZINE -DM) 6.25-15 MG/5ML syrup Take 5 mLs by mouth  every 8 (eight) hours as needed for cough. 11/29/24  Yes Teresa Almarie LABOR, PA-C    Family History Family History  Problem Relation Age of Onset   Cervical cancer Mother    COPD Father     Social History Social History   Tobacco Use   Smoking status: Former    Types: Cigarettes   Smokeless tobacco: Never  Vaping Use   Vaping status: Never Used  Substance Use Topics   Alcohol use: Not Currently   Drug use: Never     Allergies   Dust mite extract   Review of Systems Review of Systems  Constitutional:  Positive for fatigue. Negative for chills and fever.  HENT:  Positive for congestion, rhinorrhea and sore throat. Negative for ear pain.   Eyes:  Negative for pain and visual disturbance.  Respiratory:  Positive for cough, chest tightness, shortness  of breath and wheezing.   Cardiovascular:  Negative for chest pain and palpitations.  Gastrointestinal:  Negative for abdominal pain and vomiting.  Genitourinary:  Negative for dysuria and hematuria.  Musculoskeletal:  Negative for arthralgias and back pain.  Skin:  Negative for color change and rash.  Neurological:  Negative for seizures and syncope.  All other systems reviewed and are negative.    Physical Exam Triage Vital Signs ED Triage Vitals  Encounter Vitals Group     BP 11/29/24 1127 136/81     Girls Systolic BP Percentile --      Girls Diastolic BP Percentile --      Boys Systolic BP Percentile --      Boys Diastolic BP Percentile --      Pulse Rate 11/29/24 1127 86     Resp 11/29/24 1127 (!) 24     Temp 11/29/24 1127 97.7 F (36.5 C)     Temp Source 11/29/24 1127 Oral     SpO2 11/29/24 1127 (S) 93 %     Weight 11/29/24 1125 199 lb 15.3 oz (90.7 kg)     Height 11/29/24 1125 5' (1.524 m)     Head Circumference --      Peak Flow --      Pain Score 11/29/24 1123 0     Pain Loc --      Pain Education --      Exclude from Growth Chart --    No data found.  Updated Vital Signs BP 136/81 (BP Location:  Left Arm)   Pulse 86   Temp 97.7 F (36.5 C) (Oral)   Resp (!) 24   Ht 5' (1.524 m)   Wt 199 lb 15.3 oz (90.7 kg)   SpO2 (S) 93%   BMI 39.05 kg/m   Visual Acuity Right Eye Distance:   Left Eye Distance:   Bilateral Distance:    Right Eye Near:   Left Eye Near:    Bilateral Near:     Physical Exam Vitals and nursing note reviewed.  Constitutional:      General: She is not in acute distress.    Appearance: She is well-developed.  HENT:     Head: Normocephalic and atraumatic.  Eyes:     Conjunctiva/sclera: Conjunctivae normal.  Cardiovascular:     Rate and Rhythm: Normal rate and regular rhythm.     Heart sounds: No murmur heard. Pulmonary:     Effort: Pulmonary effort is normal. Tachypnea present. No respiratory distress.     Breath sounds: Examination of the right-upper field reveals decreased breath sounds and wheezing. Examination of the left-upper field reveals decreased breath sounds and wheezing. Examination of the right-middle field reveals decreased breath sounds. Examination of the left-middle field reveals decreased breath sounds. Examination of the right-lower field reveals decreased breath sounds. Examination of the left-lower field reveals decreased breath sounds. Decreased breath sounds and wheezing present.  Abdominal:     Palpations: Abdomen is soft.     Tenderness: There is no abdominal tenderness.  Musculoskeletal:        General: No swelling.     Cervical back: Neck supple.  Skin:    General: Skin is warm and dry.     Capillary Refill: Capillary refill takes less than 2 seconds.  Neurological:     Mental Status: She is alert.  Psychiatric:        Mood and Affect: Mood normal.      UC Treatments / Results  Labs (all  labs ordered are listed, but only abnormal results are displayed) Labs Reviewed - No data to display  EKG   Radiology DG Chest 2 View Result Date: 11/29/2024 EXAM: 2 VIEW(S) XRAY OF THE CHEST 11/29/2024 12:09:49 PM COMPARISON:  09/03/2024 CLINICAL HISTORY: Shortness of breath, cough, asthma, and wheezing for 1 week. FINDINGS: LUNGS AND PLEURA: No focal pulmonary opacity. No pleural effusion. No pneumothorax. HEART AND MEDIASTINUM: No acute abnormality of the cardiac and mediastinal silhouettes. BONES AND SOFT TISSUES: Mild thoracic spondylosis. No acute osseous abnormality. IMPRESSION: 1. No acute cardiopulmonary process. 2. Mild thoracic spondylosis. Electronically signed by: Ryan Salvage MD 11/29/2024 12:36 PM EST RP Workstation: [id number]    Procedures Procedures (including critical care time)  Medications Ordered in UC Medications  methylPREDNISolone  sodium succinate (SOLU-MEDROL ) 125 mg/2 mL injection 80 mg (80 mg Intramuscular Given 11/29/24 1156)  ipratropium-albuterol  (DUONEB) 0.5-2.5 (3) MG/3ML nebulizer solution 3 mL (3 mLs Nebulization Given 11/29/24 1157)    Initial Impression / Assessment and Plan / UC Course  I have reviewed the triage vital signs and the nursing notes.  Pertinent labs & imaging results that were available during my care of the patient were reviewed by me and considered in my medical decision making (see chart for details).     Acute bronchitis, unspecified organism  Acute cough - Plan: DG Chest 2 View, DG Chest 2 View  Mild intermittent asthma with exacerbation - Plan: DG Chest 2 View, DG Chest 2 View  Shortness of breath - Plan: DG Chest 2 View, DG Chest 2 View  Wheezing - Plan: DG Chest 2 View, DG Chest 2 View   Chest x-ray done today.  Final evaluation by the radiologist does not show any acute process however physical exam findings and symptoms are most consistent with an acute bronchitis along with asthma exacerbation.  Due to the severity and duration we will treat with antibiotics by mouth.  We have treated with the following: Medrol  injection given today. This is a steroid to help with swelling and allergic reaction.  DuoNeb breathing treatment done today in clinic.   With improvement in lung sounds. Augmentin  875 mg twice daily for 7 days.  This is an antibiotic.  Take this with food. Start 11/30/24 Prednisone  50 mg once daily for 4 days. Take this in the morning.  This is a steroid to help with inflammation and pain. Promethazine  DM 5 mL every 8 hours as needed for cough.  Use caution as this medication can cause drowsiness.  Make sure to stay hydrated by drinking plenty of water. Return to urgent care or PCP if symptoms worsen or fail to resolve.    Final Clinical Impressions(s) / UC Diagnoses   Final diagnoses:  Acute cough  Mild intermittent asthma with exacerbation  Shortness of breath  Wheezing  Acute bronchitis, unspecified organism     Discharge Instructions      Chest x-ray done today.  Final evaluation by the radiologist does not show any acute process however physical exam findings and symptoms are most consistent with an acute bronchitis along with asthma exacerbation.  Due to the severity and duration we will treat with antibiotics by mouth.  We have treated with the following: Medrol  injection given today. This is a steroid to help with swelling and allergic reaction.  DuoNeb breathing treatment done today in clinic.  With improvement in lung sounds. Augmentin  875 mg twice daily for 7 days.  This is an antibiotic.  Take this with food. Start 11/30/24 Prednisone   50 mg once daily for 4 days. Take this in the morning.  This is a steroid to help with inflammation and pain. Promethazine  DM 5 mL every 8 hours as needed for cough.  Use caution as this medication can cause drowsiness.  Make sure to stay hydrated by drinking plenty of water. Return to urgent care or PCP if symptoms worsen or fail to resolve.       ED Prescriptions     Medication Sig Dispense Auth. Provider   amoxicillin -clavulanate (AUGMENTIN ) 875-125 MG tablet Take 1 tablet by mouth every 12 (twelve) hours. 14 tablet Neville Pauls A, PA-C   predniSONE  (DELTASONE ) 50  MG tablet Take 1 tablet (50 mg total) by mouth daily with breakfast. 4 tablet Libbey Duce A, PA-C   promethazine -dextromethorphan (PROMETHAZINE -DM) 6.25-15 MG/5ML syrup Take 5 mLs by mouth every 8 (eight) hours as needed for cough. 180 mL Teresa Almarie LABOR, [place]      PDMP not reviewed this encounter.   Teresa Almarie LABOR, PA-C 11/29/24 1245

## 2024-11-30 ENCOUNTER — Ambulatory Visit: Payer: Self-pay
